# Patient Record
Sex: Female | Born: 1965 | Race: White | Hispanic: No | Marital: Married | Smoking: Never smoker
Health system: Southern US, Community
[De-identification: ages and names within clinical notes are randomized; demographics above are authoritative.]

## PROBLEM LIST (undated history)

## (undated) DIAGNOSIS — E785 Hyperlipidemia, unspecified: Secondary | ICD-10-CM

## (undated) DIAGNOSIS — K5900 Constipation, unspecified: Secondary | ICD-10-CM

## (undated) DIAGNOSIS — F329 Major depressive disorder, single episode, unspecified: Secondary | ICD-10-CM

## (undated) DIAGNOSIS — D126 Benign neoplasm of colon, unspecified: Secondary | ICD-10-CM

## (undated) DIAGNOSIS — T7840XA Allergy, unspecified, initial encounter: Secondary | ICD-10-CM

## (undated) DIAGNOSIS — D649 Anemia, unspecified: Secondary | ICD-10-CM

## (undated) DIAGNOSIS — K529 Noninfective gastroenteritis and colitis, unspecified: Secondary | ICD-10-CM

## (undated) DIAGNOSIS — F32A Depression, unspecified: Secondary | ICD-10-CM

## (undated) HISTORY — DX: Hyperlipidemia, unspecified: E78.5

## (undated) HISTORY — DX: Anemia, unspecified: D64.9

## (undated) HISTORY — DX: Noninfective gastroenteritis and colitis, unspecified: K52.9

## (undated) HISTORY — PX: PARTIAL HYSTERECTOMY: SHX80

## (undated) HISTORY — DX: Allergy, unspecified, initial encounter: T78.40XA

## (undated) HISTORY — DX: Major depressive disorder, single episode, unspecified: F32.9

## (undated) HISTORY — DX: Depression, unspecified: F32.A

## (undated) HISTORY — DX: Benign neoplasm of colon, unspecified: D12.6

## (undated) HISTORY — DX: Constipation, unspecified: K59.00

## (undated) HISTORY — PX: POLYPECTOMY: SHX149

---

## 1997-10-24 ENCOUNTER — Other Ambulatory Visit: Admission: RE | Admit: 1997-10-24 | Discharge: 1997-10-24 | Payer: Self-pay | Admitting: Obstetrics and Gynecology

## 1998-09-18 ENCOUNTER — Other Ambulatory Visit: Admission: RE | Admit: 1998-09-18 | Discharge: 1998-09-18 | Payer: Self-pay | Admitting: Obstetrics and Gynecology

## 1999-03-02 ENCOUNTER — Inpatient Hospital Stay (HOSPITAL_COMMUNITY): Admission: AD | Admit: 1999-03-02 | Discharge: 1999-03-02 | Payer: Self-pay | Admitting: Obstetrics and Gynecology

## 1999-03-02 ENCOUNTER — Encounter: Payer: Self-pay | Admitting: Obstetrics and Gynecology

## 1999-03-03 ENCOUNTER — Observation Stay (HOSPITAL_COMMUNITY): Admission: AD | Admit: 1999-03-03 | Discharge: 1999-03-04 | Payer: Self-pay | Admitting: *Deleted

## 1999-03-21 ENCOUNTER — Inpatient Hospital Stay (HOSPITAL_COMMUNITY): Admission: AD | Admit: 1999-03-21 | Discharge: 1999-03-23 | Payer: Self-pay | Admitting: Obstetrics & Gynecology

## 1999-11-04 ENCOUNTER — Other Ambulatory Visit: Admission: RE | Admit: 1999-11-04 | Discharge: 1999-11-04 | Payer: Self-pay | Admitting: Obstetrics and Gynecology

## 2000-04-06 ENCOUNTER — Observation Stay (HOSPITAL_COMMUNITY): Admission: RE | Admit: 2000-04-06 | Discharge: 2000-04-07 | Payer: Self-pay | Admitting: Obstetrics and Gynecology

## 2000-04-06 ENCOUNTER — Encounter (INDEPENDENT_AMBULATORY_CARE_PROVIDER_SITE_OTHER): Payer: Self-pay

## 2006-03-19 ENCOUNTER — Emergency Department (HOSPITAL_COMMUNITY): Admission: EM | Admit: 2006-03-19 | Discharge: 2006-03-19 | Payer: Self-pay | Admitting: Emergency Medicine

## 2006-03-22 ENCOUNTER — Encounter: Admission: RE | Admit: 2006-03-22 | Discharge: 2006-03-22 | Payer: Self-pay | Admitting: Internal Medicine

## 2006-05-31 HISTORY — PX: COLONOSCOPY: SHX174

## 2006-08-17 ENCOUNTER — Encounter: Admission: RE | Admit: 2006-08-17 | Discharge: 2006-08-17 | Payer: Self-pay | Admitting: Obstetrics and Gynecology

## 2007-04-13 ENCOUNTER — Encounter: Admission: RE | Admit: 2007-04-13 | Discharge: 2007-04-13 | Payer: Self-pay | Admitting: Obstetrics and Gynecology

## 2007-09-28 ENCOUNTER — Encounter: Admission: RE | Admit: 2007-09-28 | Discharge: 2007-09-28 | Payer: Self-pay | Admitting: Obstetrics and Gynecology

## 2008-01-25 IMAGING — CR DG ABDOMEN ACUTE W/ 1V CHEST
3 series · 3 of 3 positions shown · non-contrast
Comparison: none

CLINICAL DATA: Constipation with abdominal pain and left lower quadrant fullness.  
 ACUTE ABDOMINAL SERIES WITH CHEST ? 3 VIEW:
 No comparison.

[w chest pa]
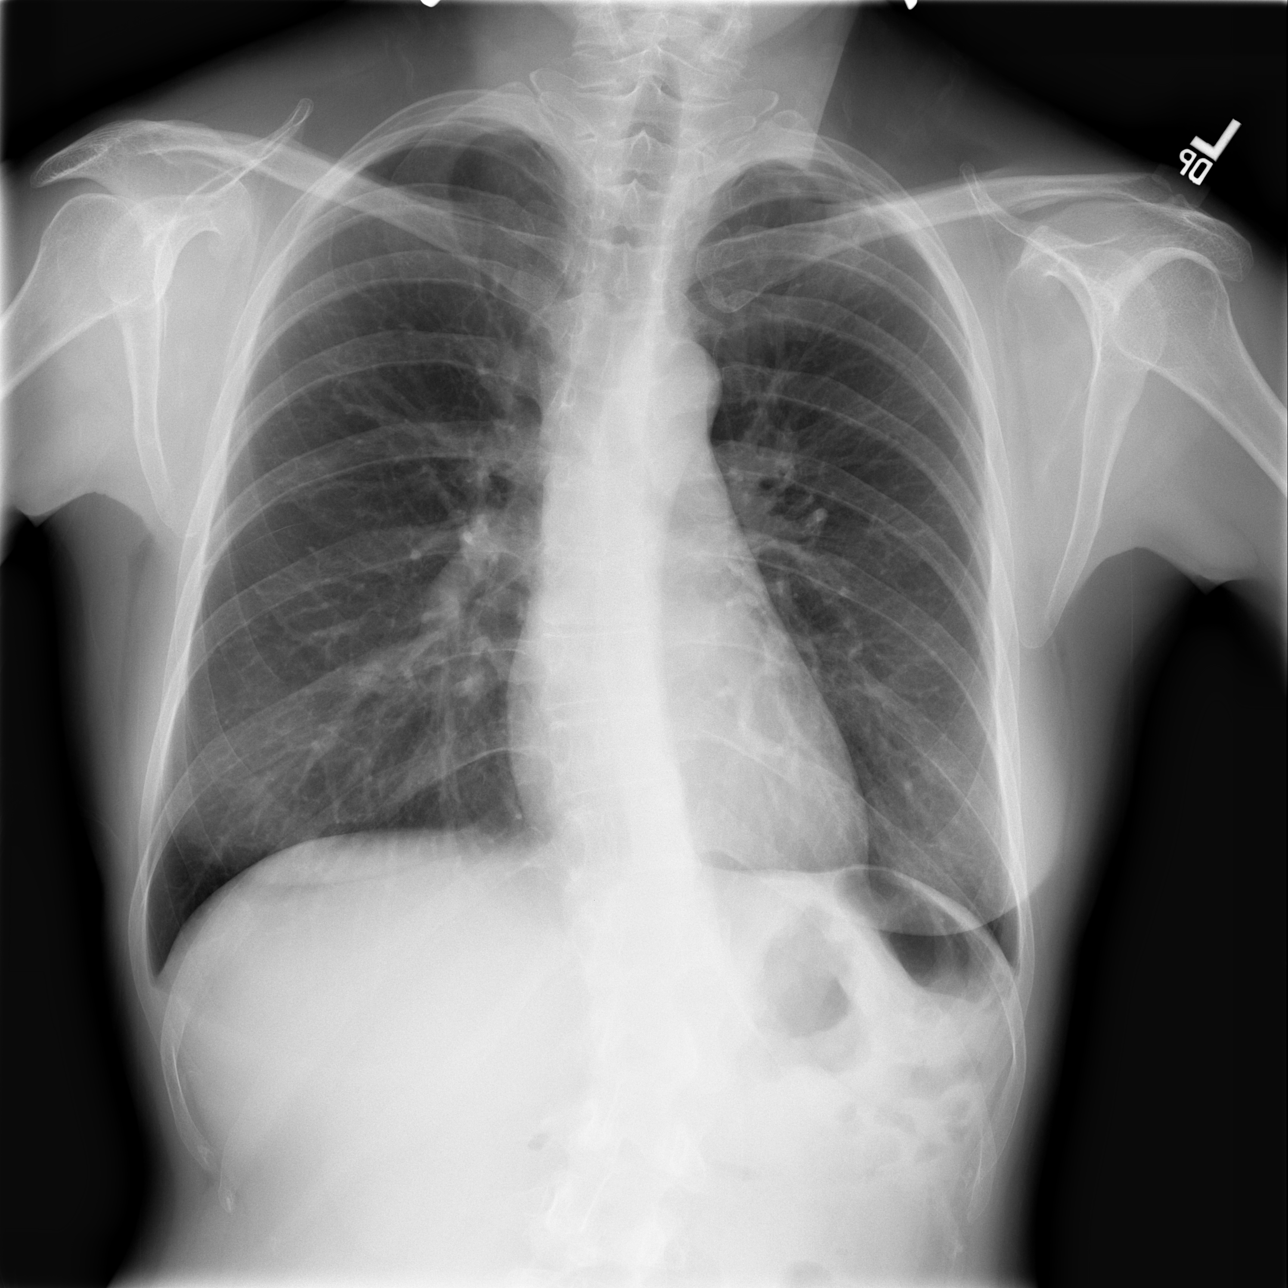

[w abdomen upright *]
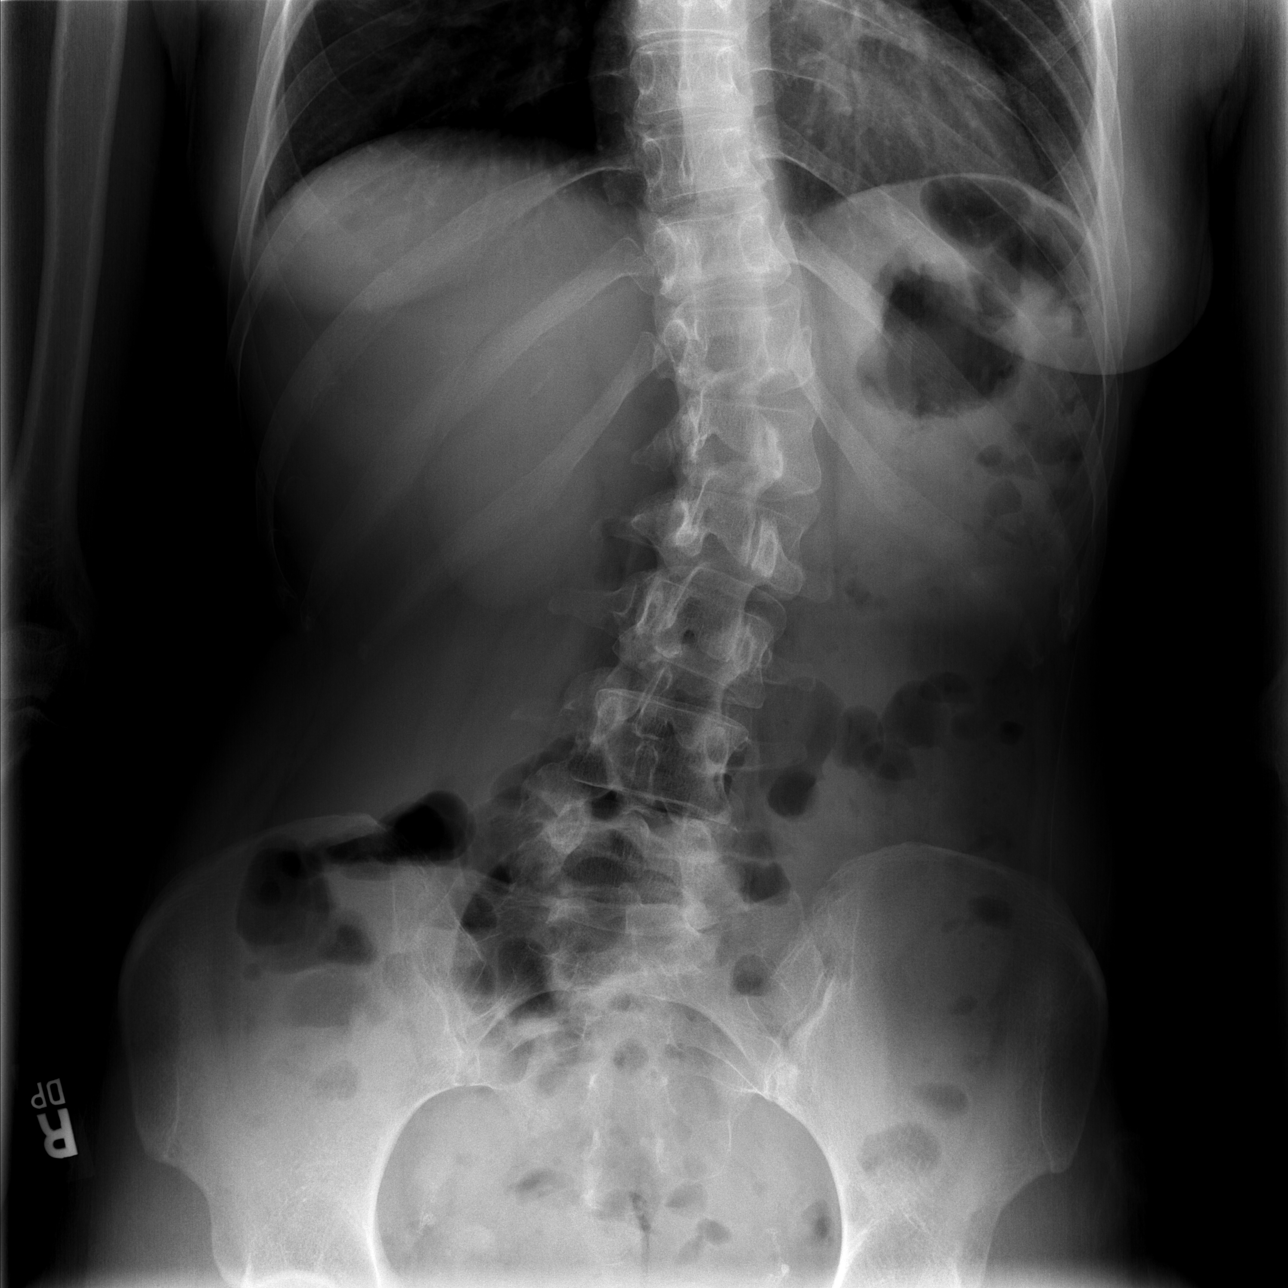

[t abdomen supine]
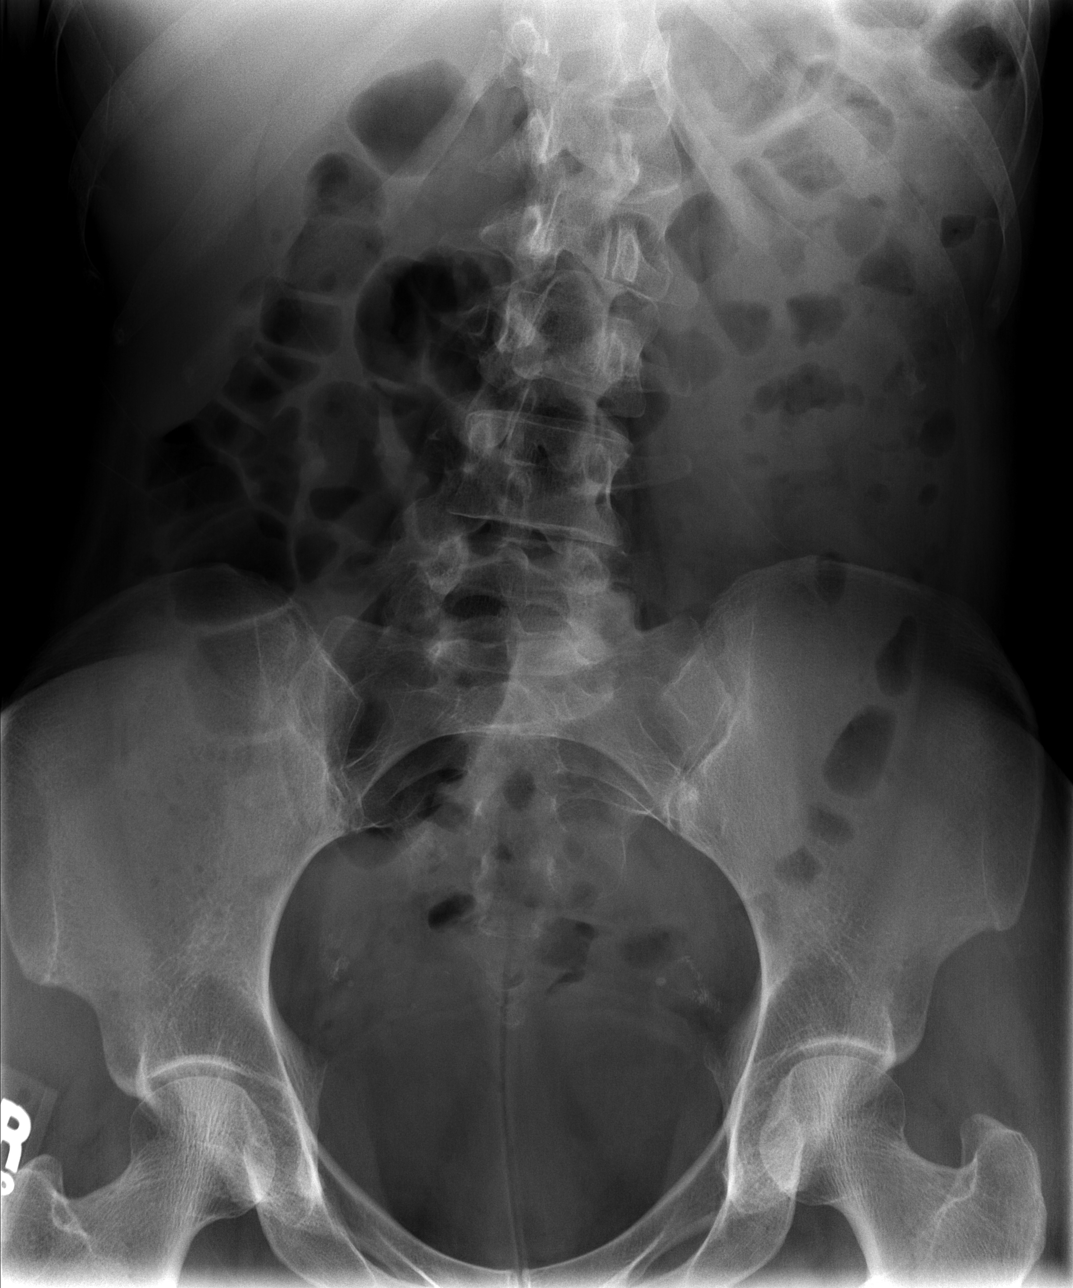

[3 of 3 positions shown; findings below may reference images not displayed]

FINDINGS: Frontal chest radiograph demonstrates a biconvex thoracolumbar scoliosis.  The cardiomediastinal contours are normal.  The lungs are clear. 
 Supine and erect views of the abdomen demonstrate a normal nonobstructive bowel gas pattern.  There does not appear to be significantly increased stool in the colon.  Pelvic surgical clips suggest previous tubal ligation.  There are no suspicious calcifications.
IMPRESSION: No active cardiopulmonary or abdominal process is demonstrated.  There is a thoracolumbar scoliosis.

## 2010-06-21 ENCOUNTER — Encounter: Payer: Self-pay | Admitting: Obstetrics and Gynecology

## 2010-10-16 NOTE — H&P (Signed)
[facility]  Patient:    Claudia Ellison                          MRN: [id number] Adm. Date:  04/06/00 Attending:  Trevor Iha, M.D.                         History and Physical  DATE OF BIRTH:                1965-07-05  HISTORY OF PRESENT ILLNESS:   Ms. Claudia Ellison is a 45 year old, G3, P3, A1, with a history of worsening menometrorrhagia since her last delivery approximately a year ago.  She continues to have bleeding every couple of weeks.  She has been tried on oral contraceptive agents, which not only did not work but also caused severe migraines and she was unable to take.  She had a sonohysterogram which showed no endometrial pathology but did show increased uterine size at 10.5 x 5.5 x 5.5 cm with a 6 mm endometrial thickness.  Because of the persistent abnormal uterine bleeding, no control with conservative medical management, the patient desires hysterectomy.  She presents today for laparoscopic-assisted vaginal hysterectomy.  PAST MEDICAL HISTORY:         Significant for mild depression and anxiety disorder, currently doing very well on Prozac.  MEDICATIONS:                  Medications at this time include Prozac 20 mg a day.  ALLERGIES:                    She has no known drug allergies.  PAST SURGICAL HISTORY:        Negative.  PAST OBSTETRICAL HISTORY:     She has had three vaginal deliveries and one miscarriage.  PHYSICAL EXAMINATION:  VITAL SIGNS:                  Blood pressure is 110/80, weight 137.  HEART:                        Regular rate and rhythm.  LUNGS:                        Clear to auscultation bilaterally.  ABDOMEN:                      Nondistended and nontender.  PELVIC:                       Examination reveals an anteverted mobile uterus, nontender, approximately 8 weeks size.  No adnexal masses are palpable.  IMPRESSION AND PLAN:          Menometrorrhagia not responsive to  conservative management.  Normal sonohysterogram.  The patient desires definitive surgical intervention and request hysterectomy.  Risks and benefits were discussed at length including but not limited to risk of infection, bleeding, damage to bowel or bladder, ureters, tubes, ovaries, need for further surgery.  Also risks associated with blood transfusion, including but not limited to risk of infection such as HIV or hepatitis or reaction to blood transfusion.  The patient does give her informed consent.  Plan, laparoscopic-assisted vaginal hysterectomy. DD:  04/06/00 TD:  04/06/00 Job: [id number]

## 2010-10-16 NOTE — Discharge Summary (Signed)
[facility] [place]  Patient:    Claudia Ellison, Claudia Ellison                   MRN: [id number] Adm. Date:  [id number] Disc. Date: [id number] Attending:  Trevor Iha                           Discharge Summary  HISTORY OF PRESENT ILLNESS:   [name] is a 45 year old G4, P3, A1 with menorrhagia and metrorrhagia for approximately the last year, grossly enlarged uterus to approximately 8-10 weeks size.  Sonohysterogram reveals no endometrial pathology.  The menomenorrhagia is not controlled with oral contraceptive agents or anti-inflammatory medications.  Patient desires definitive surgical intervention, requesting hysterectomy.  She was admitted for a laparoscopically assisted vaginal hysterectomy.  HOSPITAL COURSE:              Patient underwent laparoscopically assisted vaginal hysterectomy.  The surgery was uncomplicated.  Findings at the time of surgery were normal appendix, ovaries, tubes, liver, gallbladder, enlarged uterus approximately 8 weeks size.  Again, the surgery was uncomplicated with an estimated blood loss of 50 cc.  Her postoperative care as uncomplicated. Postoperative day #1 her hemoglobin was 11.2.  She had normal vital signs, good urine output.  By the end of postoperative day #1 she was tolerating a regular diet, ambulating without difficulty, and discharged home.  Incisions were clean, dry, and intact at time of discharge.  DISPOSITION:                  Patient to be followed up in the office in two weeks.  DISCHARGE MEDICATIONS:        1. Tylox #30.                               2. Motrin 600 mg #30.  INSTRUCTIONS:                 Instructed on light activity, return for fever, bleeding, pain, and routine instructions for postoperative hysterectomy. DD:  04/07/00 TD:  04/08/00 Job: [id number]

## 2010-10-16 NOTE — Op Note (Signed)
[facility] [place]  Patient:    Claudia Ellison, Claudia Ellison                   MRN: [id number] Adm. Date:  [id number] Attending:  Trevor Iha                           Operative Report  PREOPERATIVE DIAGNOSES:       Menomenorrhagia, enlarged uterus.  POSTOPERATIVE DIAGNOSES:      Menomenorrhagia, enlarged uterus.  PROCEDURE:                    Laparoscopically assisted vaginal hysterectomy.  SURGEON:                      Trevor Iha, M.D.  ASSISTANT:                    Duke Salvia. Marcelle Overlie, M.D.  ANESTHESIA:                   General endotracheal.  INDICATIONS:                  Ms. Claudia Ellison is a 45 year old G4, P3, A1 with menomenorrhagia for approximately the last year, grossly enlarged uterus to approximately 8-10 weeks size.  Sonohysterogram showed no endometrial pathology.  Menomenorrhagia is not controlled by oral contraceptive agents or anti-inflammatory medications.  Patients desires definitive surgical intervention, requests hysterectomy.  Risks and benefits were discussed at length.  Informed consent was obtained.  FINDINGS:                     Grossly enlarged uterus.  Normal appearing ovaries, tubes, cul-de-sac, appendix, liver, and gallbladder.  DESCRIPTION OF PROCEDURE:     After adequate general anesthesia the patient was placed in the dorsal lithotomy position.  She was sterilely prepped and draped.  Bladder was sterilely drained.  Hulka tenaculum was placed on the anterior lip of the cervix.  A 1 cm infraumbilical skin incision was made. Veress needle was inserted.  The abdomen was insufflated with dullness to percussion.  A 5 mm port was placed to the left of midline two fingerbreadths above the pubic symphysis under direct visualization.  The above findings were then noted.  The 5 mm camera was placed in the 5 mm scope and endo GI was placed through the umbilical incision and was fired twice on the left and right intra-ovarian  ligaments under direct visualization.  The broad ligaments were dissected across the round ligaments with the second endo GI firing. ______ were noted to be hemostatic and care was taken to avoid bowel and other vascular structures.  At this time the laparoscope was removed.  The abdomen was desufflated and proceeded with the vaginal portion of the case. Her legs were elevated.  Weighted speculum was placed.  Jacobs tenaculum was placed in the cervix.  Posterior colpotomy incision was made.  The cervix was circumscribed with Bovie cautery.  The Heaney clamps were clamped across the uterosacral ligaments bilaterally.  They were clamped, cut, and tied using 0 Monocryl and 0 Monocryl was used throughout the procedure unless otherwise specified.  The cardinal ligaments were then clamped, cut, and tied.  Bladder pillars clamped, cut, and tied.  At this time the bladder was dissected off the anterior surface of the cervix.  Peritoneum was entered sharply and bladder blade was placed below the  bladder.  Successive clamp, cut, and tie with 0 Monocryl were taken up to the cardinal ligaments up to the inferior portion of the round ligament.  At this time the fundus of the uterus was grasped with the ______ tenaculum and delivered into the introitus.  The remaining pedicle was clamped, cut, and tied and the uterus was removed intact.  Examination of the pedicles and the ovaries at the time revealed good hemostasis.  At this time small packing was placed.  The uterosacral ligaments were identified bilaterally, ligated again with 0 Monocryl.  The posterior peritoneum was then closed in a purse string fashion with 0 Monocryl. Uterosacral ligaments were then plicated in the midline.  The vaginal mucosa was closed in a vertical fashion with figure-of-eight and 0 Monocryl. Approximation of the uterosacral ligaments after the posterior vaginal mucosa was approximated.  The packing was removed and the anterior  vaginal mucosa was closed in a similar fashion with figure-of-eight of 0 Monocryl in a vertical fashion with good approximation and good hemostasis.  At this time the speculum was removed.  Foley catheter was replaced.  Return of clear yellow urine was found.  At this time reinsufflation of the abdomen revealed good hemostasis.  Irrigation was applied.  After adequate hemostasis was assured small areas of peritoneal edge were cauterized with bipolar cautery.  At this time the Trocars were removed, noted to be hemostatic.  The fascia and the umbilical incision was closed with 0 Vicryl.  The skin was closed with 3-0 Vicryl rapide subcuticular stitch.  The 5 mm port was closed with 3-0 Vicryl rapide subcuticular stitch.  The incisions were injected with 1/4% Marcaine. The patient was stable on transfer to the recovery room.  Sponge, needle, and instrument count was normal x 3.  Estimated blood loss of the procedure was 40 cc. DD:  04/06/00 TD:  04/06/00 Job: [id number]

## 2013-02-26 DIAGNOSIS — Z7189 Other specified counseling: Secondary | ICD-10-CM | POA: Insufficient documentation

## 2013-06-26 DIAGNOSIS — L908 Other atrophic disorders of skin: Secondary | ICD-10-CM

## 2013-06-26 DIAGNOSIS — L988 Other specified disorders of the skin and subcutaneous tissue: Secondary | ICD-10-CM | POA: Insufficient documentation

## 2017-08-29 ENCOUNTER — Encounter: Payer: Self-pay | Admitting: Gastroenterology

## 2017-10-04 ENCOUNTER — Ambulatory Visit (AMBULATORY_SURGERY_CENTER): Payer: Self-pay

## 2017-10-04 VITALS — Ht 67.0 in | Wt 151.8 lb

## 2017-10-04 DIAGNOSIS — Z1211 Encounter for screening for malignant neoplasm of colon: Secondary | ICD-10-CM

## 2017-10-04 MED ORDER — NA SULFATE-K SULFATE-MG SULF 17.5-3.13-1.6 GM/177ML PO SOLN: 1.0000 | Freq: Once | ORAL | 0 refills | Status: AC

## 2017-10-04 NOTE — Progress Notes (Signed)
Per pt, no allergies to soy or egg products.Pt not taking any weight loss meds or using  O2 at home.  Pt refused emmi video. 

## 2017-10-14 ENCOUNTER — Telehealth: Payer: Self-pay | Admitting: Gastroenterology

## 2017-10-14 ENCOUNTER — Encounter: Payer: Self-pay | Admitting: Gastroenterology

## 2017-10-14 NOTE — Telephone Encounter (Signed)
Ok to charge

## 2017-10-18 ENCOUNTER — Encounter: Payer: Self-pay | Admitting: Gastroenterology

## 2017-12-19 ENCOUNTER — Ambulatory Visit (AMBULATORY_SURGERY_CENTER): Payer: Self-pay | Admitting: *Deleted

## 2017-12-19 VITALS — Ht 67.0 in | Wt 144.0 lb

## 2017-12-19 DIAGNOSIS — Z1211 Encounter for screening for malignant neoplasm of colon: Secondary | ICD-10-CM

## 2017-12-19 NOTE — Progress Notes (Signed)
Patient denies any allergies to eggs or soy. Patient denies any problems with anesthesia/sedation. Patient denies any oxygen use at home. Patient denies taking any diet/weight loss medications or blood thinners. EMMI education offered pt declined. Patient states she has liquid stool and constipation, she takes magnesium and this helps. Explained to patient she may need to take laxative/Miralax if she has constipation before exam. Per pt no changes in history since last pv. Pt has sample suprep at home.

## 2017-12-30 ENCOUNTER — Encounter: Payer: Self-pay | Admitting: Gastroenterology

## 2017-12-30 ENCOUNTER — Ambulatory Visit (AMBULATORY_SURGERY_CENTER): Payer: 59 | Admitting: Gastroenterology

## 2017-12-30 VITALS — BP 125/89 | HR 72 | Temp 98.2°F | Resp 18 | Ht 67.0 in | Wt 144.0 lb

## 2017-12-30 DIAGNOSIS — Z1211 Encounter for screening for malignant neoplasm of colon: Secondary | ICD-10-CM

## 2017-12-30 DIAGNOSIS — K529 Noninfective gastroenteritis and colitis, unspecified: Secondary | ICD-10-CM

## 2017-12-30 DIAGNOSIS — K633 Ulcer of intestine: Secondary | ICD-10-CM

## 2017-12-30 DIAGNOSIS — D122 Benign neoplasm of ascending colon: Secondary | ICD-10-CM

## 2017-12-30 MED ORDER — SODIUM CHLORIDE 0.9 % IV SOLN: 500.0000 mL | Freq: Once | INTRAVENOUS | Status: DC

## 2017-12-30 NOTE — Progress Notes (Signed)
Called to room to assist during endoscopic procedure.  Patient ID and intended procedure confirmed with present staff. Received instructions for my participation in the procedure from the performing physician.  

## 2017-12-30 NOTE — Progress Notes (Signed)
Pt's states no medical or surgical changes since previsit or office visit. 

## 2017-12-30 NOTE — Patient Instructions (Signed)
*   Handout given on polyps, hemorrhoids, no aspirin, ibuprofen, naproxen, or other non-steroidal anti inflammatory drugs*  YOU HAD AN ENDOSCOPIC PROCEDURE TODAY AT [facility]:   Refer to the procedure report that was given to you for any specific questions about what was found during the examination.  If the procedure report does not answer your questions, please call your gastroenterologist to clarify.  If you requested that your care partner not be given the details of your procedure findings, then the procedure report has been included in a sealed envelope for you to review at your convenience later.  YOU SHOULD EXPECT: Some feelings of bloating in the abdomen. Passage of more gas than usual.  Walking can help get rid of the air that was put into your GI tract during the procedure and reduce the bloating. If you had a lower endoscopy (such as a colonoscopy or flexible sigmoidoscopy) you may notice spotting of blood in your stool or on the toilet paper. If you underwent a bowel prep for your procedure, you may not have a normal bowel movement for a few days.  Please Note:  You might notice some irritation and congestion in your nose or some drainage.  This is from the oxygen used during your procedure.  There is no need for concern and it should clear up in a day or so.  SYMPTOMS TO REPORT IMMEDIATELY:   Following lower endoscopy (colonoscopy or flexible sigmoidoscopy):  Excessive amounts of blood in the stool  Significant tenderness or worsening of abdominal pains  Swelling of the abdomen that is new, acute  Fever of 100F or higher   For urgent or emergent issues, a gastroenterologist can be reached at any hour by calling [phone].   DIET:  We do recommend a small meal at first, but then you may proceed to your regular diet.  Drink plenty of fluids but you should avoid alcoholic beverages for 24 hours.  ACTIVITY:  You should plan to take it easy for the rest of today  and you should NOT DRIVE or use heavy machinery until tomorrow (because of the sedation medicines used during the test).    FOLLOW UP: Our staff will call the number listed on your records the next business day following your procedure to check on you and address any questions or concerns that you may have regarding the information given to you following your procedure. If we do not reach you, we will leave a message.  However, if you are feeling well and you are not experiencing any problems, there is no need to return our call.  We will assume that you have returned to your regular daily activities without incident.  If any biopsies were taken you will be contacted by phone or by letter within the next 1-3 weeks.  Please call us at [phone] if you have not heard about the biopsies in 3 weeks.    SIGNATURES/CONFIDENTIALITY: You and/or your care partner have signed paperwork which will be entered into your electronic medical record.  These signatures attest to the fact that that the information above on your After Visit Summary has been reviewed and is understood.  Full responsibility of the confidentiality of this discharge information lies with you and/or your care-partner.

## 2017-12-30 NOTE — Op Note (Signed)
[facility] Patient Name: Claudia Ellison Procedure Date: 12/30/2017 9:52 AM MRN: [id number] Endoscopist: Mauri Pole , MD Age: 52 Referring MD:  Date of Birth: 1965/10/17 Gender: Female Account #: [id number] Procedure:                Colonoscopy Indications:              Screening for colorectal malignant neoplasm Medicines:                Monitored Anesthesia Care Procedure:                Pre-Anesthesia Assessment:                           - Prior to the procedure, a History and Physical                            was performed, and patient medications and                            allergies were reviewed. The patient's tolerance of                            previous anesthesia was also reviewed. The risks                            and benefits of the procedure and the sedation                            options and risks were discussed with the patient.                            All questions were answered, and informed consent                            was obtained. Prior Anticoagulants: The patient has                            taken no previous anticoagulant or antiplatelet                            agents. ASA Grade Assessment: II - A patient with                            mild systemic disease. After reviewing the risks                            and benefits, the patient was deemed in                            satisfactory condition to undergo the procedure.                           After obtaining informed consent, the colonoscope  was passed under direct vision. Throughout the                            procedure, the patient's blood pressure, pulse, and                            oxygen saturations were monitored continuously. The                            Colonoscope was introduced through the anus and                            advanced to the the cecum, identified by                            appendiceal orifice and  ileocecal valve. The                            colonoscopy was somewhat difficult due to                            restricted mobility of the colon and significant                            looping. Successful completion of the procedure was                            aided by applying abdominal pressure. The patient                            tolerated the procedure well. The quality of the                            bowel preparation was adequate. The ileocecal                            valve, appendiceal orifice, and rectum were                            photographed. Scope In: 9:53:43 AM Scope Out: 10:27:00 AM Scope Withdrawal Time: 0 hours 16 minutes 33 seconds  Total Procedure Duration: 0 hours 33 minutes 17 seconds  Findings:                 The perianal and digital rectal examinations were                            normal.                           A 12 mm polyp was found in the ascending colon. The                            polyp was sessile. The polyp was removed with a  cold snare. Resection and retrieval were complete.                           A 14 mm polyp was found in the ascending colon. The                            polyp was mixed lateral spreading. The polyp was                            removed with a piecemeal technique using a cold                            snare. Resection and retrieval were complete.                           A few scattered non-bleeding erosions were found in                            the recto-sigmoid colon, in the sigmoid colon and                            in the descending colon. Biopsies were taken with a                            cold forceps for histology.                           Non-bleeding internal hemorrhoids were found during                            retroflexion. The hemorrhoids were small. Complications:            No immediate complications. Estimated Blood Loss:     Estimated blood loss  was minimal. Impression:               - One 12 mm polyp in the ascending colon, removed                            with a cold snare. Resected and retrieved.                           - One 14 mm polyp in the ascending colon, removed                            piecemeal using a cold snare. Resected and                            retrieved.                           - A few erosions in the recto-sigmoid colon, in the                            sigmoid colon and in the  descending colon. Biopsied.                           - Non-bleeding internal hemorrhoids. Recommendation:           - Patient has a contact number available for                            emergencies. The signs and symptoms of potential                            delayed complications were discussed with the                            patient. Return to normal activities tomorrow.                            Written discharge instructions were provided to the                            patient.                           - Resume previous diet.                           - Continue present medications.                           - Await pathology results.                           - Repeat colonoscopy in 1 year for surveillance                            after piecemeal polypectomy and for surveillance                            based on pathology results.                           - No aspirin, ibuprofen, naproxen, or other                            non-steroidal anti-inflammatory drugs. Mauri Pole, MD 12/30/2017 10:32:11 AM This report has been signed electronically.

## 2017-12-30 NOTE — Progress Notes (Signed)
A/ox3, pleased with MAC, report to RN 

## 2018-01-02 ENCOUNTER — Telehealth: Payer: Self-pay | Admitting: *Deleted

## 2018-01-02 NOTE — Telephone Encounter (Signed)
  Follow up Call-  Call back number 12/30/2017  Post procedure Call Back phone  # [phone]  Permission to leave phone message Yes  Some recent data might be hidden    [facility]

## 2018-01-02 NOTE — Telephone Encounter (Signed)
  Follow up Call-  Call back number 12/30/2017  Post procedure Call Back phone  # [phone]  Permission to leave phone message Yes  Some recent data might be hidden    [facility]

## 2018-01-04 ENCOUNTER — Other Ambulatory Visit: Payer: Self-pay

## 2018-01-04 MED ORDER — MESALAMINE 1.2 G PO TBEC: 1.2000 g | DELAYED_RELEASE_TABLET | Freq: Every day | ORAL | 3 refills | Status: DC

## 2018-03-15 ENCOUNTER — Ambulatory Visit: Payer: 59 | Admitting: Gastroenterology

## 2018-04-13 ENCOUNTER — Ambulatory Visit (INDEPENDENT_AMBULATORY_CARE_PROVIDER_SITE_OTHER): Payer: 59 | Admitting: Gastroenterology

## 2018-04-13 ENCOUNTER — Encounter: Payer: Self-pay | Admitting: Gastroenterology

## 2018-04-13 VITALS — BP 126/80 | HR 128 | Ht 67.0 in | Wt 141.4 lb

## 2018-04-13 DIAGNOSIS — K51818 Other ulcerative colitis with other complication: Secondary | ICD-10-CM

## 2018-04-13 DIAGNOSIS — R197 Diarrhea, unspecified: Secondary | ICD-10-CM

## 2018-04-13 MED ORDER — MESALAMINE 1.2 G PO TBEC: 2.4000 g | DELAYED_RELEASE_TABLET | Freq: Every day | ORAL | 3 refills | Status: DC

## 2018-04-13 NOTE — Progress Notes (Signed)
Claudia Ellison    [id number]    05-22-1966  Primary Care Physician:Shaw, Gwyndolyn Saxon, MD  Referring Physician: Marton Redwood, MD [address] [place], [place] [address]  Chief complaint: Diarrhea  HPI: 52 year old female here for follow-up visit after colonoscopy. She continues to have intermittent episodes of diarrhea.  Did not notice any significant improvement with Lialda.  Denies any blood in stool or blood per rectum  Colonoscopy December 31, 2007: Sessile polyps (sessile serrated adenoma) greater than 1 cm in the right colon removed piecemeal, mild colitis in the rectosigmoid area scattered erosions and erythema.  Biopsies suggestive of acute colitis.  Differential includes acute ischemia versus NSAID's versus infectious versus early IBD  No abdominal pain, nausea, vomiting, loss of appetite or weight loss.  No recent antibiotics or travel.  She takes magnesium oxide on and off.   Outpatient Encounter Medications as of 04/13/2018  Medication Sig  . buPROPion (WELLBUTRIN XL) 300 MG 24 hr tablet Take 300 mg by mouth daily.  Marland Kitchen estradiol (ESTRACE) 2 MG tablet Take 2 mg by mouth daily.  . Magnesium Oxide (MAG-200 PO) Take 400 mg by mouth daily.  . mesalamine (LIALDA) 1.2 g EC tablet Take 1 tablet (1.2 g total) by mouth daily with breakfast.  . Multiple Vitamin (MULTIVITAMIN) tablet Take 1 tablet by mouth daily.   Facility-Administered Encounter Medications as of 04/13/2018  Medication  . 0.9 %  sodium chloride infusion    Allergies as of 04/13/2018  . (No Known Allergies)    Past Medical History:  Diagnosis Date  . Depression     Past Surgical History:  Procedure Laterality Date  . COLONOSCOPY  2008  . PARTIAL HYSTERECTOMY  at age 52    Family History  Problem Relation Age of Onset  . Heart disease Mother   . Colon cancer Neg Hx   . Esophageal cancer Neg Hx   . Stomach cancer Neg Hx   . Rectal cancer Neg Hx     Social History   Socioeconomic  History  . Marital status: Married    Spouse name: Not on file  . Number of children: Not on file  . Years of education: Not on file  . Highest education level: Not on file  Occupational History  . Not on file  Social Needs  . Financial resource strain: Not on file  . Food insecurity:    Worry: Not on file    Inability: Not on file  . Transportation needs:    Medical: Not on file    Non-medical: Not on file  Tobacco Use  . Smoking status: Never Smoker  . Smokeless tobacco: Never Used  Substance and Sexual Activity  . Alcohol use: Yes    Alcohol/week: 6.0 - 7.0 standard drinks    Types: 6 - 7 Standard drinks or equivalent per week    Comment: 6-7  . Drug use: Not Currently  . Sexual activity: Not on file  Lifestyle  . Physical activity:    Days per week: Not on file    Minutes per session: Not on file  . Stress: Not on file  Relationships  . Social connections:    Talks on phone: Not on file    Gets together: Not on file    Attends religious service: Not on file    Active member of club or organization: Not on file    Attends meetings of clubs or organizations: Not on file  Relationship status: Not on file  . Intimate partner violence:    Fear of current or ex partner: Not on file    Emotionally abused: Not on file    Physically abused: Not on file    Forced sexual activity: Not on file  Other Topics Concern  . Not on file  Social History Narrative  . Not on file      Review of systems: Review of Systems  Constitutional: Negative for fever and chills.  HENT: Negative.   Eyes: Negative for blurred vision.  Respiratory: Negative for cough, shortness of breath and wheezing.   Cardiovascular: Negative for chest pain and palpitations.  Gastrointestinal: as per HPI Genitourinary: Negative for dysuria, urgency, frequency and hematuria.  Musculoskeletal: Negative for myalgias, back pain and joint pain.  Skin: Negative for itching and rash.  Neurological:  Negative for dizziness, tremors, focal weakness, seizures and loss of consciousness.  Endo/Heme/Allergies: Negative for seasonal allergies.  Psychiatric/Behavioral: Negative for depression, suicidal ideas and hallucinations.  All other systems reviewed and are negative.   Physical Exam: Vitals:   04/13/18 0852  BP: 126/80  Pulse: (!) 128   Body mass index is 22.14 kg/m. Gen:      No acute distress HEENT:  EOMI, sclera anicteric Neck:     No masses; no thyromegaly Lungs:    Clear to auscultation bilaterally; normal respiratory effort CV:         Regular rate and rhythm; no murmurs Abd:      + bowel sounds; soft, non-tender; no palpable masses, no distension Ext:    No edema; adequate peripheral perfusion Skin:      Warm and dry; no rash Neuro: alert and oriented x 3 Psych: normal mood and affect  Data Reviewed:  Reviewed labs, radiology imaging, old records and pertinent past GI work up   Assessment and Plan/Recommendations:  52 year old female with chronic intermittent diarrhea. Noted findings suggestive of active colitis in rectosigmoid area during recent colonoscopy Advised patient to avoid NSAIDs Will increase Lialda to 2.4 g daily.  If no significant improvement will consider starting Uceris 9 mg daily Start Benefiber 1 teaspoon 3 times daily with meals Stop magnesium oxide Return in 3 months or sooner if needed  25 minutes was spent face-to-face with the patient. Greater than 50% of the time used for counseling as well as treatment plan and follow-up. She had multiple questions which were answered to her satisfaction  K. Denzil Magnuson , MD [phone]    CC: Marton Redwood, MD

## 2018-04-13 NOTE — Patient Instructions (Addendum)
We have sent a new prescription of Lialda to your pharmacy, Increase to two tablets once daily  Take Benefiber 1 teaspoon three times a day with meals  STOP magnesium oxide    If you are age 53 or older, your body mass index should be between 23-30. Your Body mass index is 22.14 kg/m. If this is out of the aforementioned range listed, please consider follow up with your Primary Care Provider.  If you are age 24 or younger, your body mass index should be between 19-25. Your Body mass index is 22.14 kg/m. If this is out of the aformentioned range listed, please consider follow up with your Primary Care Provider.    Thank you for choosing [facility]  Karleen Hampshire Nandigam,MD

## 2018-05-17 ENCOUNTER — Ambulatory Visit (INDEPENDENT_AMBULATORY_CARE_PROVIDER_SITE_OTHER): Payer: 59 | Admitting: Gastroenterology

## 2018-05-17 ENCOUNTER — Encounter: Payer: Self-pay | Admitting: Gastroenterology

## 2018-05-17 VITALS — BP 137/80 | HR 100 | Ht 67.0 in | Wt 144.0 lb

## 2018-05-17 DIAGNOSIS — K5909 Other constipation: Secondary | ICD-10-CM | POA: Diagnosis not present

## 2018-05-17 DIAGNOSIS — K5180 Other ulcerative colitis without complications: Secondary | ICD-10-CM | POA: Diagnosis not present

## 2018-05-17 MED ORDER — MESALAMINE ER 0.375 G PO CP24: 750.0000 mg | ORAL_CAPSULE | Freq: Every day | ORAL | 6 refills | Status: DC

## 2018-05-17 NOTE — Patient Instructions (Signed)
We have sent in [place] to your pharmacy to see if its covered better by your insurance   Take Miralax 1 capful every other day  Take Benefiber 1 teaspoon three times a day  Follow up in 6 months  If you are age 52 or older, your body mass index should be between 23-30. Your Body mass index is 36.25 kg/m. If this is out of the aforementioned range listed, please consider follow up with your Primary Care Provider.  If you are age 32 or younger, your body mass index should be between 19-25. Your Body mass index is 36.25 kg/m. If this is out of the aformentioned range listed, please consider follow up with your Primary Care Provider.    Thank you for choosing [facility]  Karleen Hampshire Nandigam,MD

## 2018-05-17 NOTE — Progress Notes (Signed)
Claudia Ellison    [id number]    1965-08-20  Primary Care Physician:Shaw, Gwyndolyn Saxon, MD  Referring Physician: Marton Redwood, MD [address] [place],  [address]  Chief complaint: Colitis  HPI:  52 year old female here for follow-up visit.  Last seen in office April 13, 2018  Colonoscopy December 31, 2007: Sessile polyps (sessile serrated adenoma) greater than 1 cm in the right colon removed piecemeal, mild colitis in the rectosigmoid area scattered erosions and erythema.  Biopsies suggestive of acute colitis.  Differential includes acute ischemia versus NSAID's versus infectious versus early IBD  Diarrhea has resolved with Lialda 2 tablets daily and she also stopped taking magnesium oxide.  She no longer has fecal urgency.  Currently having bowel movement 2-3 times a week.  She has abdominal discomfort if does not have a bowel movement for more than 3 to 4 days.  She is taking Benefiber daily.  Has intermittent abdominal bloating especially if does not have a bowel movement for more than 3 or 4 days.  Denies any rectal bleeding, nausea, vomiting, loss of appetite or weight loss.    Outpatient Encounter Medications as of 05/17/2018  Medication Sig  . buPROPion (WELLBUTRIN XL) 300 MG 24 hr tablet Take 300 mg by mouth daily.  Marland Kitchen estradiol (ESTRACE) 2 MG tablet Take 2 mg by mouth daily.  . mesalamine (LIALDA) 1.2 g EC tablet Take 2 tablets (2.4 g total) by mouth daily with breakfast.  . Multiple Vitamin (MULTIVITAMIN) tablet Take 1 tablet by mouth daily.  . [DISCONTINUED] Magnesium Oxide (MAG-200 PO) Take 400 mg by mouth daily.   Facility-Administered Encounter Medications as of 05/17/2018  Medication  . 0.9 %  sodium chloride infusion    Allergies as of 05/17/2018  . (No Known Allergies)    Past Medical History:  Diagnosis Date  . Adenomatous colon polyp   . Colitis   . Depression     Past Surgical History:  Procedure Laterality Date  . COLONOSCOPY   2008  . PARTIAL HYSTERECTOMY  at age 50    Family History  Problem Relation Age of Onset  . Heart disease Mother   . Colon cancer Neg Hx   . Esophageal cancer Neg Hx   . Stomach cancer Neg Hx   . Rectal cancer Neg Hx     Social History   Socioeconomic History  . Marital status: Married    Spouse name: Not on file  . Number of children: Not on file  . Years of education: Not on file  . Highest education level: Not on file  Occupational History  . Not on file  Social Needs  . Financial resource strain: Not on file  . Food insecurity:    Worry: Not on file    Inability: Not on file  . Transportation needs:    Medical: Not on file    Non-medical: Not on file  Tobacco Use  . Smoking status: Never Smoker  . Smokeless tobacco: Never Used  Substance and Sexual Activity  . Alcohol use: Yes    Alcohol/week: 6.0 - 7.0 standard drinks    Types: 6 - 7 Standard drinks or equivalent per week    Comment: 6-7  . Drug use: Not Currently  . Sexual activity: Not on file  Lifestyle  . Physical activity:    Days per week: Not on file    Minutes per session: Not on file  . Stress: Not on file  Relationships  . Social connections:    Talks on phone: Not on file    Gets together: Not on file    Attends religious service: Not on file    Active member of club or organization: Not on file    Attends meetings of clubs or organizations: Not on file    Relationship status: Not on file  . Intimate partner violence:    Fear of current or ex partner: Not on file    Emotionally abused: Not on file    Physically abused: Not on file    Forced sexual activity: Not on file  Other Topics Concern  . Not on file  Social History Narrative  . Not on file      Review of systems: Review of Systems  Constitutional: Negative for fever and chills.  HENT: Negative.   Eyes: Negative for blurred vision.  Respiratory: Negative for cough, shortness of breath and wheezing.   Cardiovascular:  Negative for chest pain and palpitations.  Gastrointestinal: as per HPI Genitourinary: Negative for dysuria, urgency, frequency and hematuria.  Musculoskeletal: Negative for myalgias, back pain and joint pain.  Skin: Negative for itching and rash.  Neurological: Negative for dizziness, tremors, focal weakness, seizures and loss of consciousness.  Endo/Heme/Allergies: Positive for seasonal allergies.  Psychiatric/Behavioral: Negative for depression, suicidal ideas and hallucinations.  All other systems reviewed and are negative.   Physical Exam: Vitals:   05/17/18 1329  BP: 137/80  Pulse: 100  SpO2: 94%   Body mass index is 22.55 kg/m. Gen:      No acute distress HEENT:  EOMI, sclera anicteric Neck:     No masses; no thyromegaly Lungs:    Clear to auscultation bilaterally; normal respiratory effort CV:         Regular rate and rhythm; no murmurs Abd:      + bowel sounds; soft, non-tender; no palpable masses, no distension Ext:    No edema; adequate peripheral perfusion Skin:      Warm and dry; no rash Neuro: alert and oriented x 3 Psych: normal mood and affect  Data Reviewed:  Reviewed labs, radiology imaging, old records and pertinent past GI work up   Assessment and Plan/Recommendations: 52 year old female with intermittent diarrhea colonoscopy with biopsies suggestive of active colitis in the rectosigmoid colon Diarrhea has improved with Lialda 2.4 g daily Avoid NSAIDs  Currently having worsening constipation Advised patient to continue Benefiber 1 teaspoon 3 times daily Increase water intake to 8 to 10 cups daily Start MiraLAX 1 capful daily as needed Return in 6 months   K. Denzil Magnuson , MD [phone]    CC: Marton Redwood, MD

## 2018-05-25 ENCOUNTER — Telehealth: Payer: Self-pay | Admitting: Gastroenterology

## 2018-05-25 NOTE — Telephone Encounter (Signed)
Pt's husband asks for an alt pscrp for Apriso sent to [facility].  He states that med was too expensive - $194.  Pt would like a call back.

## 2018-05-25 NOTE — Telephone Encounter (Signed)
Pt husband states that he needs a call back today the medication at the pharm was too expensive. And they are needing something else called in .

## 2018-05-26 ENCOUNTER — Telehealth: Payer: Self-pay | Admitting: Gastroenterology

## 2018-05-26 NOTE — Telephone Encounter (Signed)
Pt states other medication isnt covered either and she tried good rx and the price is all the same or more

## 2018-05-26 NOTE — Telephone Encounter (Signed)
I spoke with Will who is Claudia Ellison's husband and he said Claudia Ellison was at the [facility]. He is going to see if they can find out what the insurance covers and investigate other options such as [facility] and call us back.

## 2018-05-26 NOTE — Telephone Encounter (Signed)
Spoke to [place] and she is going to call her insurance company and call us back with what they cover at a better cost.

## 2018-05-26 NOTE — Telephone Encounter (Signed)
Any suggestions Dr Silverio Decamp? Thank you.

## 2018-05-26 NOTE — Telephone Encounter (Signed)
Please check if Lialda or Delzicol is covered better? Thanks

## 2018-05-29 NOTE — Telephone Encounter (Signed)
ok 

## 2018-06-01 NOTE — Telephone Encounter (Signed)
I spoke with Claudia Ellison and she said her insurance charges [phone] for those medicines as well per month. She said she cannot do that. She checked on [facility] and it was over $200.00. Any other suggestions for her , thank you.

## 2018-06-05 NOTE — Telephone Encounter (Signed)
Please check if Sulfasalazine is covered ? Sulfasalazine 1gm BID and also advise patient to take Folic acid 1mg  daily along with sulfasalazine. Thanks

## 2018-06-05 NOTE — Telephone Encounter (Signed)
See the other phone notes under 05/25/2018 date.

## 2018-06-05 NOTE — Telephone Encounter (Signed)
I have called and spoken with husband Will. He requested I call back and leave a detailed message as he was driving to pick up Jana Half. I have done this and will await their call back.

## 2018-06-12 NOTE — Telephone Encounter (Signed)
Spoke with Denya and she said she misunderstood the message and that she will call the insurance company and call me back.

## 2018-06-14 ENCOUNTER — Telehealth: Payer: Self-pay | Admitting: Gastroenterology

## 2018-06-14 MED ORDER — SULFASALAZINE 500 MG PO TABS: 1000.0000 mg | ORAL_TABLET | Freq: Two times a day (BID) | ORAL | 11 refills | Status: DC

## 2018-06-14 NOTE — Telephone Encounter (Signed)
Please call the patient at [phone] She did not have any luck with talking to her insurance. They were unhelpful. Very frustrated. She looked at [facility] and wants to talk with you.  Thanks

## 2018-06-14 NOTE — Addendum Note (Signed)
Addended by: [place], Mir Fullilove E on: 06/14/2018 02:38 PM   Modules accepted: Orders

## 2018-06-14 NOTE — Telephone Encounter (Signed)
I spoke with Claudia Ellison and she has the goodrx app on her phone. She can get the sulfasalazine for $15.00 per month at the [facility]. I confirmed the dosage in person with Dr Silverio Decamp and sent it in. She will pick up the folic acid when she goes to the pharmacy.

## 2018-06-14 NOTE — Telephone Encounter (Signed)
I spoke with Jana Half , see phone note from 05/25/2018.

## 2019-01-01 ENCOUNTER — Encounter: Payer: Self-pay | Admitting: Gastroenterology

## 2019-01-04 ENCOUNTER — Encounter: Payer: Self-pay | Admitting: Gastroenterology

## 2019-01-08 DIAGNOSIS — K529 Noninfective gastroenteritis and colitis, unspecified: Secondary | ICD-10-CM | POA: Insufficient documentation

## 2019-01-08 DIAGNOSIS — D126 Benign neoplasm of colon, unspecified: Secondary | ICD-10-CM | POA: Insufficient documentation

## 2019-01-16 ENCOUNTER — Other Ambulatory Visit: Payer: Self-pay

## 2019-01-16 ENCOUNTER — Ambulatory Visit (AMBULATORY_SURGERY_CENTER): Payer: Self-pay | Admitting: *Deleted

## 2019-01-16 VITALS — Temp 97.3°F | Ht 67.0 in | Wt 134.0 lb

## 2019-01-16 DIAGNOSIS — K529 Noninfective gastroenteritis and colitis, unspecified: Secondary | ICD-10-CM

## 2019-01-16 DIAGNOSIS — Z86010 Personal history of colonic polyps: Secondary | ICD-10-CM

## 2019-01-16 MED ORDER — SUPREP BOWEL PREP KIT 17.5-3.13-1.6 GM/177ML PO SOLN: 1.0000 | Freq: Once | ORAL | 0 refills | Status: AC

## 2019-01-16 NOTE — Progress Notes (Signed)
No egg or soy allergy known to patient  No issues with past sedation with any surgeries  or procedures, no intubation problems  No diet pills per patient No home 02 use per patient  No blood thinners per patient  Pt states  issues with constipation - uses Benefiber daily and uses Miralax prn only  -will do a 2 day Suprep due to her constipation issues  No A fib or A flutter  EMMI video declined

## 2019-01-26 ENCOUNTER — Encounter: Payer: Self-pay | Admitting: Gastroenterology

## 2019-02-06 ENCOUNTER — Encounter: Payer: 59 | Admitting: Gastroenterology

## 2019-02-22 ENCOUNTER — Telehealth: Payer: Self-pay | Admitting: Gastroenterology

## 2019-02-22 NOTE — Telephone Encounter (Signed)
Spoke with pt. Reviewed prep instructions and changes in the time of prep with pt and answered her questions. She will call back if she has any further questions. Gwyndolyn Saxon in [facility]

## 2019-02-27 ENCOUNTER — Telehealth: Payer: Self-pay

## 2019-02-27 NOTE — Telephone Encounter (Signed)
Covid-19 screening questions   Do you now or have you had a fever in the last 14 days? NO   Do you have any respiratory symptoms of shortness of breath or cough now or in the last 14 days? NO  Do you have any family members or close contacts with diagnosed or suspected Covid-19 in the past 14 days? NO  Have you been tested for Covid-19 and found to be positive? NO        

## 2019-02-28 ENCOUNTER — Encounter: Payer: Self-pay | Admitting: Gastroenterology

## 2019-02-28 ENCOUNTER — Other Ambulatory Visit: Payer: Self-pay

## 2019-02-28 ENCOUNTER — Ambulatory Visit (AMBULATORY_SURGERY_CENTER): Payer: 59 | Admitting: Gastroenterology

## 2019-02-28 VITALS — BP 116/88 | HR 85 | Temp 98.2°F | Resp 14 | Ht 67.0 in | Wt 134.0 lb

## 2019-02-28 DIAGNOSIS — K635 Polyp of colon: Secondary | ICD-10-CM | POA: Diagnosis not present

## 2019-02-28 DIAGNOSIS — Z8719 Personal history of other diseases of the digestive system: Secondary | ICD-10-CM | POA: Diagnosis not present

## 2019-02-28 DIAGNOSIS — Z86010 Personal history of colonic polyps: Secondary | ICD-10-CM | POA: Diagnosis not present

## 2019-02-28 DIAGNOSIS — D122 Benign neoplasm of ascending colon: Secondary | ICD-10-CM

## 2019-02-28 MED ORDER — SODIUM CHLORIDE 0.9 % IV SOLN: 500.0000 mL | Freq: Once | INTRAVENOUS | Status: AC

## 2019-02-28 NOTE — Progress Notes (Signed)
Temp check by KA/vital check by CW.  Patient stated no changes in medical or surgical history since pre-visit screening on 01/16/19.

## 2019-02-28 NOTE — Progress Notes (Signed)
Called to room to assist during endoscopic procedure.  Patient ID and intended procedure confirmed with present staff. Received instructions for my participation in the procedure from the performing physician.  

## 2019-02-28 NOTE — Progress Notes (Signed)
Pt stable. VSS. Arousable. To recovery.

## 2019-02-28 NOTE — Op Note (Signed)
[facility] Patient Name: Claudia Ellison Procedure Date: 02/28/2019 11:30 AM MRN: [id number] Endoscopist: Mauri Pole , MD Age: 53 Referring MD:  Date of Birth: 1965-07-06 Gender: Female Account #: [id number] Procedure:                Colonoscopy Indications:              High risk colon cancer surveillance: Personal                            history of colonic polyps, Surveillance: Piecemeal                            removal of large sessile adenoma last colonoscopy                            (< 3 yrs) Medicines:                Monitored Anesthesia Care Procedure:                Pre-Anesthesia Assessment:                           - Prior to the procedure, a History and Physical                            was performed, and patient medications and                            allergies were reviewed. The patient's tolerance of                            previous anesthesia was also reviewed. The risks                            and benefits of the procedure and the sedation                            options and risks were discussed with the patient.                            All questions were answered, and informed consent                            was obtained. Prior Anticoagulants: The patient has                            taken no previous anticoagulant or antiplatelet                            agents. ASA Grade Assessment: II - A patient with                            mild systemic disease. After reviewing the risks  and benefits, the patient was deemed in                            satisfactory condition to undergo the procedure.                           After obtaining informed consent, the colonoscope                            was passed under direct vision. Throughout the                            procedure, the patient's blood pressure, pulse, and                            oxygen saturations were monitored continuously.  The                            Colonoscope was introduced through the anus and                            advanced to the the terminal ileum, with                            identification of the appendiceal orifice and IC                            valve. The colonoscopy was performed without                            difficulty. The patient tolerated the procedure                            well. The quality of the bowel preparation was                            excellent. The terminal ileum, ileocecal valve,                            appendiceal orifice, and rectum were photographed. Scope In: 11:37:53 AM Scope Out: 11:53:49 AM Scope Withdrawal Time: 0 hours 8 minutes 1 second  Total Procedure Duration: 0 hours 15 minutes 56 seconds  Findings:                 The perianal and digital rectal examinations were                            normal.                           A 5 mm polyp was found in the ascending colon. The                            polyp was sessile. The polyp was removed with a  cold snare. Resection and retrieval were complete.                           Normal mucosa was found in the entire colon.                            Biopsies were taken with a cold forceps for                            histology.                           Non-bleeding internal hemorrhoids were found during                            retroflexion. The hemorrhoids were small. Complications:            No immediate complications. Estimated Blood Loss:     Estimated blood loss was minimal. Impression:               - One 5 mm polyp in the ascending colon, removed                            with a cold snare. Resected and retrieved.                           - Normal mucosa in the entire examined colon.                            Biopsied.                           - Non-bleeding internal hemorrhoids. Recommendation:           - Patient has a contact number available for                             emergencies. The signs and symptoms of potential                            delayed complications were discussed with the                            patient. Return to normal activities tomorrow.                            Written discharge instructions were provided to the                            patient.                           - Resume previous diet.                           - Continue present medications.                           -  Await pathology results.                           - Repeat colonoscopy in 5 years for surveillance                            based on pathology results. Mauri Pole, MD 02/28/2019 11:57:35 AM This report has been signed electronically.

## 2019-02-28 NOTE — Patient Instructions (Signed)
YOU HAD AN ENDOSCOPIC PROCEDURE TODAY AT THE [facility]:   Refer to the procedure report that was given to you for any specific questions about what was found during the examination.  If the procedure report does not answer your questions, please call your gastroenterologist to clarify.  If you requested that your care partner not be given the details of your procedure findings, then the procedure report has been included in a sealed envelope for you to review at your convenience later.  YOU SHOULD EXPECT: Some feelings of bloating in the abdomen. Passage of more gas than usual.  Walking can help get rid of the air that was put into your GI tract during the procedure and reduce the bloating. If you had a lower endoscopy (such as a colonoscopy or flexible sigmoidoscopy) you may notice spotting of blood in your stool or on the toilet paper. If you underwent a bowel prep for your procedure, you may not have a normal bowel movement for a few days.  Please Note:  You might notice some irritation and congestion in your nose or some drainage.  This is from the oxygen used during your procedure.  There is no need for concern and it should clear up in a day or so.  SYMPTOMS TO REPORT IMMEDIATELY:   Following lower endoscopy (colonoscopy or flexible sigmoidoscopy):  Excessive amounts of blood in the stool  Significant tenderness or worsening of abdominal pains  Swelling of the abdomen that is new, acute  Fever of 100F or higher  For urgent or emergent issues, a gastroenterologist can be reached at any hour by calling [phone].   DIET:  We do recommend a small meal at first, but then you may proceed to your regular diet.  Drink plenty of fluids but you should avoid alcoholic beverages for 24 hours.  ACTIVITY:  You should plan to take it easy for the rest of today and you should NOT DRIVE or use heavy machinery until tomorrow (because of the sedation medicines used during the test).     FOLLOW UP: Our staff will call the number listed on your records 48-72 hours following your procedure to check on you and address any questions or concerns that you may have regarding the information given to you following your procedure. If we do not reach you, we will leave a message.  We will attempt to reach you two times.  During this call, we will ask if you have developed any symptoms of COVID 19. If you develop any symptoms (ie: fever, flu-like symptoms, shortness of breath, cough etc.) before then, please call [phone].  If you test positive for Covid 19 in the 2 weeks post procedure, please call and report this information to us.    If any biopsies were taken you will be contacted by phone or by letter within the next 1-3 weeks.  Please call us at [phone] if you have not heard about the biopsies in 3 weeks.    SIGNATURES/CONFIDENTIALITY: You and/or your care partner have signed paperwork which will be entered into your electronic medical record.  These signatures attest to the fact that that the information above on your After Visit Summary has been reviewed and is understood.  Full responsibility of the confidentiality of this discharge information lies with you and/or your care-partner. 

## 2019-03-02 ENCOUNTER — Telehealth: Payer: Self-pay

## 2019-03-02 NOTE — Telephone Encounter (Signed)
First post procedure follow up call, no answer 

## 2019-03-02 NOTE — Telephone Encounter (Signed)
Attempted to reach patient for post-procedure f/u call. No answer. Left message for her to please not hesitate to call us if she has any questions/concerns regarding her care. 

## 2019-03-08 ENCOUNTER — Encounter: Payer: Self-pay | Admitting: Gastroenterology

## 2019-06-18 ENCOUNTER — Other Ambulatory Visit: Payer: Self-pay | Admitting: Gastroenterology

## 2019-08-16 ENCOUNTER — Ambulatory Visit: Payer: 59 | Attending: Internal Medicine

## 2019-08-16 DIAGNOSIS — Z23 Encounter for immunization: Secondary | ICD-10-CM

## 2019-08-16 NOTE — Progress Notes (Signed)
   Covid-19 Vaccination Clinic  Name:  Zeinab Grandinetti    MRN: [id number] DOB: 02-Jan-1966  08/16/2019  Ms. Pillsbury was observed post Covid-19 immunization for 15 minutes without incident. She was provided with Vaccine Information Sheet and instruction to access the V-Safe system.   Ms. Ostdiek was instructed to call 911 with any severe reactions post vaccine: Marland Kitchen Difficulty breathing  . Swelling of face and throat  . A fast heartbeat  . A bad rash all over body  . Dizziness and weakness   Immunizations Administered    Name Date Dose VIS Date Route   Pfizer COVID-19 Vaccine 08/16/2019  4:16 PM 0.3 mL 05/11/2019 Intramuscular   Manufacturer: [place]   Lot: [id number]   [place]: [id number]

## 2019-09-10 ENCOUNTER — Ambulatory Visit: Payer: 59 | Attending: Internal Medicine

## 2019-09-10 DIAGNOSIS — Z23 Encounter for immunization: Secondary | ICD-10-CM

## 2019-09-10 NOTE — Progress Notes (Signed)
   Covid-19 Vaccination Clinic  Name:  Dariany Graunke    MRN: [id number] DOB: 25-Jul-1965  09/10/2019  Ms. Saxby was observed post Covid-19 immunization for 15 minutes without incident. She was provided with Vaccine Information Sheet and instruction to access the V-Safe system.   Ms. Justo was instructed to call 911 with any severe reactions post vaccine: Marland Kitchen Difficulty breathing  . Swelling of face and throat  . A fast heartbeat  . A bad rash all over body  . Dizziness and weakness   Immunizations Administered    Name Date Dose VIS Date Route   Pfizer COVID-19 Vaccine 09/10/2019  4:24 PM 0.3 mL 05/11/2019 Intramuscular   Manufacturer: [place]   Lot: [id number]   [place]: [id number]

## 2019-11-23 ENCOUNTER — Other Ambulatory Visit: Payer: Self-pay | Admitting: Gastroenterology

## 2019-11-23 NOTE — Telephone Encounter (Signed)
Patient needs office appointment scheduled  Sent 1 refill of sulfasalazine to pts pharmacy

## 2020-01-24 ENCOUNTER — Ambulatory Visit: Payer: 59 | Admitting: Gastroenterology

## 2020-03-07 ENCOUNTER — Telehealth: Payer: Self-pay | Admitting: Gastroenterology

## 2020-03-07 ENCOUNTER — Other Ambulatory Visit: Payer: Self-pay | Admitting: Gastroenterology

## 2020-03-07 NOTE — Telephone Encounter (Signed)
Medicine has been refilled as requested. I was unable to reach her husband to inform him , phone rang and rang. I attached a note for her to call and set up a yearly appointment.

## 2020-03-07 NOTE — Telephone Encounter (Signed)
Pt's spouse is requesting a refill on the pt's AZULFIDINE.  [facility] at the corner of [address], [place].

## 2020-04-29 ENCOUNTER — Ambulatory Visit (INDEPENDENT_AMBULATORY_CARE_PROVIDER_SITE_OTHER): Payer: 59

## 2020-04-29 ENCOUNTER — Encounter: Payer: Self-pay | Admitting: Orthopedic Surgery

## 2020-04-29 ENCOUNTER — Ambulatory Visit (INDEPENDENT_AMBULATORY_CARE_PROVIDER_SITE_OTHER): Payer: 59 | Admitting: Orthopedic Surgery

## 2020-04-29 ENCOUNTER — Ambulatory Visit: Payer: Self-pay

## 2020-04-29 DIAGNOSIS — M79671 Pain in right foot: Secondary | ICD-10-CM

## 2020-04-29 DIAGNOSIS — M21611 Bunion of right foot: Secondary | ICD-10-CM | POA: Diagnosis not present

## 2020-04-29 DIAGNOSIS — M79672 Pain in left foot: Secondary | ICD-10-CM

## 2020-04-29 DIAGNOSIS — M21612 Bunion of left foot: Secondary | ICD-10-CM | POA: Diagnosis not present

## 2020-04-29 NOTE — Progress Notes (Signed)
Office Visit Note   Patient: Claudia Ellison           Date of Birth: Oct 21, 1965           MRN: [id number] Visit Date: 04/29/2020              Requested by: Marton Redwood, MD [address] [place],  [place] [address] PCP: Marton Redwood, MD  Chief Complaint  Patient presents with  . Left Foot - Pain  . Right Foot - Pain      HPI: Patient is a 54 year old woman who presents complaining of increasing bunion pain worse on the left than the right.  Patient states she was able to wear sneakers for a while but now even has pain over the bunion with wearing sneakers.  Assessment & Plan: Visit Diagnoses:  1. Bilateral foot pain   2. Bilateral bunions     Plan: Discussed that we could proceed with a chevron osteotomy and possible Akin osteotomy for the left bunion deformity risk and benefits were discussed including infection neurovascular injury persistent pain need for additional surgery.  Patient states she understands she will call to set this up in January.  Patient does have a appointment with her dermatologist this month and recommended that she have the skin lesion on the dorsum of the left great toe evaluated by dermatology with a possible biopsy.  Follow-Up Instructions: Return if symptoms worsen or fail to improve, for Patient will follow-up 1 week after surgery.   Ortho Exam  Patient is alert, oriented, no adenopathy, well-dressed, normal affect, normal respiratory effort. Examination both feet are neurovascular intact she has good ankle and subtalar motion bilaterally she has good range of motion of the MTP joint of the great toes bilaterally with a mild bunion deformity of the right moderate hallux valgus deformity on the left no hallux rigidus.  There is a small dark lesion over the base of the proximal phalanx left great toe dorsally.  There is no surrounding cellulitis this does not appear to be a scab for a pressure point.  Imaging: XR Foot Complete Left  Result  Date: 04/29/2020 2 view radiographs of the left foot shows a congruent MTP joint of the great toe with a moderate hallux valgus deformity.  XR Foot Complete Right  Result Date: 04/29/2020 2 view radiographs of the right foot shows a congruent MTP joint of the great toe there is a mild bunion deformity.  No images are attached to the encounter.  Labs: No results found for: HGBA1C, ESRSEDRATE, CRP, LABURIC, REPTSTATUS, GRAMSTAIN, CULT, LABORGA   No results found for: ALBUMIN, PREALBUMIN, LABURIC  No results found for: MG No results found for: VD25OH  No results found for: PREALBUMIN No flowsheet data found.   There is no height or weight on file to calculate BMI.  Orders:  Orders Placed This Encounter  Procedures  . XR Foot Complete Left  . XR Foot Complete Right   No orders of the defined types were placed in this encounter.    Procedures: No procedures performed  Clinical Data: No additional findings.  ROS:  All other systems negative, except as noted in the HPI. Review of Systems  Objective: Vital Signs: There were no vitals taken for this visit.  Specialty Comments:  No specialty comments available.  PMFS History: Patient Active Problem List   Diagnosis Date Noted  . Colitis   . Adenomatous colon polyp   . [place] 06/26/2013  . Other reasons for seeking  consultation 02/26/2013   Past Medical History:  Diagnosis Date  . Adenomatous colon polyp   . Allergy   . Anemia   . Colitis   . Constipation    benefiber daily   . Depression     Family History  Problem Relation Age of Onset  . Heart disease Mother   . Colon cancer Neg Hx   . Esophageal cancer Neg Hx   . Stomach cancer Neg Hx   . Rectal cancer Neg Hx   . Colon polyps Neg Hx     Past Surgical History:  Procedure Laterality Date  . COLONOSCOPY  2008  . PARTIAL HYSTERECTOMY  at age 72  . POLYPECTOMY     Social History   Occupational History  . Not on file  Tobacco Use  .  Smoking status: Never Smoker  . Smokeless tobacco: Never Used  Vaping Use  . Vaping Use: Never used  Substance and Sexual Activity  . Alcohol use: Yes    Alcohol/week: 6.0 - 7.0 standard drinks    Types: 6 - 7 Standard drinks or equivalent per week    Comment: 6-7  . Drug use: Never  . Sexual activity: Not on file

## 2020-05-16 ENCOUNTER — Other Ambulatory Visit: Payer: Self-pay | Admitting: Gastroenterology

## 2020-06-25 ENCOUNTER — Encounter: Payer: Self-pay | Admitting: Gastroenterology

## 2020-06-25 ENCOUNTER — Other Ambulatory Visit: Payer: Self-pay

## 2020-06-25 ENCOUNTER — Ambulatory Visit (INDEPENDENT_AMBULATORY_CARE_PROVIDER_SITE_OTHER): Payer: 59 | Admitting: Gastroenterology

## 2020-06-25 VITALS — BP 140/88 | HR 100 | Wt 142.4 lb

## 2020-06-25 DIAGNOSIS — K5904 Chronic idiopathic constipation: Secondary | ICD-10-CM

## 2020-06-25 DIAGNOSIS — K5120 Ulcerative (chronic) proctitis without complications: Secondary | ICD-10-CM

## 2020-06-25 MED ORDER — SULFASALAZINE 500 MG PO TABS
500.0000 mg | ORAL_TABLET | Freq: Two times a day (BID) | ORAL | 1 refills | Status: DC
Start: 2020-06-25 — End: 2020-09-02

## 2020-06-25 NOTE — Progress Notes (Signed)
Claudia Ellison    [id number]    January 25, 1966  Primary Care Physician:Shaw, Gwyndolyn Saxon, MD  Referring Physician: Marton Redwood, MD [address] [place],  [place] [address]   Chief complaint: Ulcerative colitis  HPI:  55 year old very pleasant female here for follow-up visit for ulcerative colitis.  She is currently asymptomatic.  She is taking sulfasalazine 1 g twice daily Denies any nausea, vomiting, abdominal pain, melena or bright red blood per rectum.  She has intermittent constipation, uses MiraLAX as needed.  On average she has a bowel movement once every other day.  Colonoscopy February 28, 2019: 5 mm sessile serrated polyp removed from the ascending colon.  Rest of the colon appeared normal, biopsies negative for active inflammation.  Internal hemorrhoids.  Colonoscopy December 31, 2007:Sessile polyps (sessile serrated adenoma)greater than 1 cm in the right colon removed piecemeal, mild colitis in the rectosigmoid area scattered erosions and erythema. Biopsies suggestive of acute colitis. Differential includes acute ischemia versus NSAID'sversus infectious versus early IBD     Outpatient Encounter Medications as of 06/25/2020  Medication Sig  . buPROPion (WELLBUTRIN XL) 300 MG 24 hr tablet Take 300 mg by mouth daily.  . Multiple Vitamin (MULTIVITAMIN) tablet Take 1 tablet by mouth daily.  . [DISCONTINUED] sulfaSALAzine (AZULFIDINE) 500 MG tablet TAKE 2 TABLETS(1000 MG) BY MOUTH TWICE DAILY  . Polyethylene Glycol 3350 (MIRALAX PO) Take by mouth. 119 grams for 2 day colon prep (Patient not taking: Reported on 06/25/2020)  . sulfaSALAzine (AZULFIDINE) 500 MG tablet Take 1 tablet (500 mg total) by mouth 2 (two) times daily.  . [DISCONTINUED] bisacodyl (DULCOLAX) 5 MG EC tablet Take 5 mg by mouth daily as needed for moderate constipation. X 4 tabs (Patient not taking: Reported on 06/25/2020)  . [DISCONTINUED] estradiol (ESTRACE) 2 MG tablet Take 2 mg by mouth  daily. (Patient not taking: Reported on 06/25/2020)  . [DISCONTINUED] mesalamine (APRISO) 0.375 g 24 hr capsule Take 2 capsules (0.75 g total) by mouth daily. (Patient not taking: Reported on 06/25/2020)   Facility-Administered Encounter Medications as of 06/25/2020  Medication  . 0.9 %  sodium chloride infusion    Allergies as of 06/25/2020  . (No Known Allergies)    Past Medical History:  Diagnosis Date  . Adenomatous colon polyp   . Allergy   . Anemia   . Colitis   . Constipation    benefiber daily   . Depression     Past Surgical History:  Procedure Laterality Date  . COLONOSCOPY  2008  . PARTIAL HYSTERECTOMY  at age 66  . POLYPECTOMY      Family History  Problem Relation Age of Onset  . Heart disease Mother   . Colon cancer Neg Hx   . Esophageal cancer Neg Hx   . Stomach cancer Neg Hx   . Rectal cancer Neg Hx   . Colon polyps Neg Hx     Social History   Socioeconomic History  . Marital status: Married    Spouse name: Not on file  . Number of children: Not on file  . Years of education: Not on file  . Highest education level: Not on file  Occupational History  . Not on file  Tobacco Use  . Smoking status: Never Smoker  . Smokeless tobacco: Never Used  Vaping Use  . Vaping Use: Never used  Substance and Sexual Activity  . Alcohol use: Yes    Alcohol/week: 6.0 - 7.0 standard  drinks    Types: 6 - 7 Standard drinks or equivalent per week    Comment: 6-7  . Drug use: Never  . Sexual activity: Not on file  Other Topics Concern  . Not on file  Social History Narrative  . Not on file   Social Determinants of Health   Financial Resource Strain: Not on file  Food Insecurity: Not on file  Transportation Needs: Not on file  Physical Activity: Not on file  Stress: Not on file  Social Connections: Not on file  Intimate Partner Violence: Not on file      Review of systems: All other review of systems negative except as mentioned in the  HPI.   Physical Exam: Vitals:   06/25/20 1000  BP: 140/88  Pulse: 100  SpO2: 94%   Body mass index is 22.3 kg/m. Gen:      No acute distress HEENT:  sclera anicteric Abd:      soft, non-tender; no palpable masses, no distension Ext:    No edema Neuro: alert and oriented x 3 Psych: normal mood and affect  Data Reviewed:  Reviewed labs, radiology imaging, old records and pertinent past GI work up   Assessment and Plan/Recommendations:  55 year old very pleasant female with history of ulcerative proctosigmoiditis  She had mild acute inflammation in rectosigmoid colon in 2009, unclear if it was related to NSAIDs versus infection versus ischemia She has remained asymptomatic, has been taking mesalamine and more recently sulfasalazine  Recent colonoscopy in 2020 was negative for any active inflammation  Will plan to taper down sulfasalazine dose and completely wean off as tolerated Decrease sulfasalazine dose to 500 mg twice daily for 2 weeks and then 500 mg daily for 2 weeks and stop  She has follow-up appointment with Dr. Brigitte Pulse in April and is due for her annual labs.  We will request Dr. Brigitte Pulse to order CBC, CMP, iron panel with ferritin, B12, folate, ESR and CRP for IBD health maintenance  She is up-to-date with immunizations  Continue to avoid NSAIDs  Chronic idiopathic constipation: Use Benefiber 1 tablespoon twice daily with meals Okay to continue to use MiraLAX 1 capful daily as needed Increase dietary fiber and water intake  History of adenomatous colon polyps: Due for surveillance colonoscopy in September 2025  Return in 1 year or sooner if needed  This visit required 30 minutes of patient care (this includes precharting, chart review, review of results, face-to-face time used for counseling as well as treatment plan and follow-up. The patient was provided an opportunity to ask questions and all were answered. The patient agreed with the plan and demonstrated an  understanding of the instructions.  Damaris Hippo , MD    CC: Marton Redwood, MD

## 2020-06-25 NOTE — Patient Instructions (Signed)
You will need these labs drawn from your PCP when he draws your labs ( CBC w Cdiff, CMET, ESR, CRP, B12, Folate) Please have your doctor fax the results to Dr Silverio Decamp at [phone]  Follow up in 1 year  Sulfasalazine taper:   1 tablet twice a day for 2 weeks then decrease to 1 tablet daily for 2 weeks then STOP  Take Benefiber 1 tablespoon twice daily  Take Miralax 1 capful daily as needed  Due to recent changes in healthcare laws, you may see the results of your imaging and laboratory studies on MyChart before your provider has had a chance to review them.  We understand that in some cases there may be results that are confusing or concerning to you. Not all laboratory results come back in the same time frame and the provider may be waiting for multiple results in order to interpret others.  Please give [place] 48 hours in order for your provider to thoroughly review all the results before contacting the office for clarification of your results.   If you are age 66 or older, your body mass index should be between 23-30. Your Body mass index is 22.3 kg/m. If this is out of the aforementioned range listed, please consider follow up with your Primary Care Provider.  If you are age 84 or younger, your body mass index should be between 19-25. Your Body mass index is 22.3 kg/m. If this is out of the aformentioned range listed, please consider follow up with your Primary Care Provider.    I appreciate the  opportunity to care for you  Thank You   Harl Bowie , MD

## 2020-07-08 ENCOUNTER — Ambulatory Visit (HOSPITAL_BASED_OUTPATIENT_CLINIC_OR_DEPARTMENT_OTHER): Admission: RE | Admit: 2020-07-08 | Payer: 59 | Source: Home / Self Care | Admitting: Orthopedic Surgery

## 2020-07-08 ENCOUNTER — Encounter (HOSPITAL_BASED_OUTPATIENT_CLINIC_OR_DEPARTMENT_OTHER): Admission: RE | Payer: Self-pay | Source: Home / Self Care

## 2020-07-08 SURGERY — OSTEOTOMY, WEIL
Anesthesia: Choice | Laterality: Left

## 2020-09-02 ENCOUNTER — Other Ambulatory Visit: Payer: Self-pay

## 2020-09-02 ENCOUNTER — Ambulatory Visit (INDEPENDENT_AMBULATORY_CARE_PROVIDER_SITE_OTHER): Payer: 59

## 2020-09-02 ENCOUNTER — Ambulatory Visit (INDEPENDENT_AMBULATORY_CARE_PROVIDER_SITE_OTHER): Payer: 59 | Admitting: Podiatry

## 2020-09-02 ENCOUNTER — Encounter: Payer: Self-pay | Admitting: Podiatry

## 2020-09-02 DIAGNOSIS — M21622 Bunionette of left foot: Secondary | ICD-10-CM | POA: Diagnosis not present

## 2020-09-02 DIAGNOSIS — M2012 Hallux valgus (acquired), left foot: Secondary | ICD-10-CM

## 2020-09-02 DIAGNOSIS — M85872 Other specified disorders of bone density and structure, left ankle and foot: Secondary | ICD-10-CM

## 2020-09-02 NOTE — Progress Notes (Signed)
  Subjective:  Patient ID: Claudia Ellison, female    DOB: 1965/10/11,  MRN: [id number]  Chief Complaint  Patient presents with  . Foot Pain    1st and 5th MPJ left - bunion deformities x years, starting to get more uncomfortable, wanting a lapiplasty, 2nd opinion  . New Patient (Initial Visit)    55 y.o. female presents with the above complaint. History confirmed with patient.  She is referred to me by Dr. Milinda Pointer, she is here with her husband today.  She has painful bunions on the left side both medially and laterally.  Worse in shoe gear.  Objective:  Physical Exam: warm, good capillary refill, no trophic changes or ulcerative lesions, normal DP and PT pulses and normal sensory exam. Left Foot: She has hallux valgus deformity, with hypermobility of the first ray as well as tailor's bunion deformity both have tenderness and erythema over the bony eminences   Radiographs: X-ray of the left foot: Hallux valgus metatarsus primus varus and valgus rotation, she has tailor's bunion as well, diffuse osteopenia Assessment:   1. Acquired hallux valgus of left foot   2. Tailor's bunionette, left   3. Osteopenia of left foot      Plan:  Patient was evaluated and treated and all questions answered.  Discussed the etiology and treatment including surgical and non surgical treatment for painful bunions and tailor's bunions.  She has exhausted all non surgical treatment prior to this visit including shoe gear changes and padding.  She desires surgical intervention. We discussed all risks including but not limited to: pain, swelling, infection, scar, numbness which may be temporary or permanent, chronic pain, stiffness, nerve pain or damage, wound healing problems, bone healing problems including delayed or non-union and recurrence. Specifically we discussed the following procedures: Left foot Lapidus bunionectomy with tailor's bunion correction with osteotomy and autograft from the left heel.  Informed consent was signed today. Surgery will be scheduled at a mutually agreeable date. Information regarding this will be forwarded to our surgery scheduler.   Surgical plan:  Procedure: -Lapidus bunionectomy with tailor's bunion correction with osteotomy and autograft from the left heel  Location: -[facility]  Anesthesia plan: -General anesthesia with regional block  Postoperative pain plan: - Tylenol 1000 mg every 6 hours, ibuprofen 600 mg every 6 hours, gabapentin 300 mg every 8 hours x5 days, oxycodone 5 mg 1-2 tabs every 6 hours only as needed  DVT prophylaxis: -ASA 325 mg  WB Restrictions / DME needs: -NWB in CAM boot which was dispensed today  No follow-ups on file.

## 2020-09-29 DIAGNOSIS — M859 Disorder of bone density and structure, unspecified: Secondary | ICD-10-CM | POA: Diagnosis not present

## 2020-09-29 DIAGNOSIS — Z0000 Encounter for general adult medical examination without abnormal findings: Secondary | ICD-10-CM | POA: Diagnosis not present

## 2020-09-29 DIAGNOSIS — R7989 Other specified abnormal findings of blood chemistry: Secondary | ICD-10-CM | POA: Diagnosis not present

## 2020-10-02 DIAGNOSIS — R82998 Other abnormal findings in urine: Secondary | ICD-10-CM | POA: Diagnosis not present

## 2020-10-02 DIAGNOSIS — Z1339 Encounter for screening examination for other mental health and behavioral disorders: Secondary | ICD-10-CM | POA: Diagnosis not present

## 2020-10-02 DIAGNOSIS — R030 Elevated blood-pressure reading, without diagnosis of hypertension: Secondary | ICD-10-CM | POA: Diagnosis not present

## 2020-10-02 DIAGNOSIS — Z0000 Encounter for general adult medical examination without abnormal findings: Secondary | ICD-10-CM | POA: Diagnosis not present

## 2020-10-02 DIAGNOSIS — Z1331 Encounter for screening for depression: Secondary | ICD-10-CM | POA: Diagnosis not present

## 2020-10-02 DIAGNOSIS — Z23 Encounter for immunization: Secondary | ICD-10-CM | POA: Diagnosis not present

## 2020-10-08 DIAGNOSIS — T300 Burn of unspecified body region, unspecified degree: Secondary | ICD-10-CM | POA: Diagnosis not present

## 2020-10-10 ENCOUNTER — Telehealth: Payer: Self-pay

## 2020-10-10 DIAGNOSIS — H524 Presbyopia: Secondary | ICD-10-CM | POA: Diagnosis not present

## 2020-10-10 DIAGNOSIS — H52223 Regular astigmatism, bilateral: Secondary | ICD-10-CM | POA: Diagnosis not present

## 2020-10-10 DIAGNOSIS — H5203 Hypermetropia, bilateral: Secondary | ICD-10-CM | POA: Diagnosis not present

## 2020-10-10 NOTE — Telephone Encounter (Signed)
Claudia Ellison called to cancel her surgery with Dr. Sherryle Lis on 10/31/2020. She stated she is having some issues with her insurance. Once she get everything figured out, she will call back to reschedule. Notified Dr. Sherryle Lis and Caren Griffins with [facility].

## 2020-11-05 ENCOUNTER — Other Ambulatory Visit: Payer: Self-pay | Admitting: Physician Assistant

## 2020-11-06 ENCOUNTER — Encounter: Payer: 59 | Admitting: Podiatry

## 2020-11-13 ENCOUNTER — Encounter: Payer: 59 | Admitting: Podiatry

## 2020-11-24 ENCOUNTER — Other Ambulatory Visit: Payer: Self-pay | Admitting: Physician Assistant

## 2020-11-27 ENCOUNTER — Encounter: Payer: 59 | Admitting: Podiatry

## 2020-12-02 ENCOUNTER — Ambulatory Visit (HOSPITAL_BASED_OUTPATIENT_CLINIC_OR_DEPARTMENT_OTHER): Admission: RE | Admit: 2020-12-02 | Payer: 59 | Source: Home / Self Care | Admitting: Orthopedic Surgery

## 2020-12-02 ENCOUNTER — Encounter (HOSPITAL_BASED_OUTPATIENT_CLINIC_OR_DEPARTMENT_OTHER): Admission: RE | Payer: Self-pay | Source: Home / Self Care

## 2020-12-02 SURGERY — OSTEOTOMY, WEIL
Anesthesia: Choice | Laterality: Left

## 2020-12-18 DIAGNOSIS — D1801 Hemangioma of skin and subcutaneous tissue: Secondary | ICD-10-CM | POA: Diagnosis not present

## 2020-12-18 DIAGNOSIS — L814 Other melanin hyperpigmentation: Secondary | ICD-10-CM | POA: Diagnosis not present

## 2020-12-18 DIAGNOSIS — L821 Other seborrheic keratosis: Secondary | ICD-10-CM | POA: Diagnosis not present

## 2020-12-18 DIAGNOSIS — D1723 Benign lipomatous neoplasm of skin and subcutaneous tissue of right leg: Secondary | ICD-10-CM | POA: Diagnosis not present

## 2021-02-19 ENCOUNTER — Ambulatory Visit (INDEPENDENT_AMBULATORY_CARE_PROVIDER_SITE_OTHER): Payer: Self-pay | Admitting: Podiatry

## 2021-02-19 ENCOUNTER — Other Ambulatory Visit: Payer: Self-pay

## 2021-02-19 DIAGNOSIS — M2012 Hallux valgus (acquired), left foot: Secondary | ICD-10-CM

## 2021-02-19 DIAGNOSIS — M85872 Other specified disorders of bone density and structure, left ankle and foot: Secondary | ICD-10-CM

## 2021-02-19 DIAGNOSIS — M21622 Bunionette of left foot: Secondary | ICD-10-CM

## 2021-02-24 ENCOUNTER — Encounter: Payer: Self-pay | Admitting: Podiatry

## 2021-02-24 NOTE — Progress Notes (Signed)
  Subjective:  Patient ID: Claudia Ellison, female    DOB: 1966/05/27,  MRN: [id number]  Chief Complaint  Patient presents with   Follow-up    Surgery consult    55 y.o. female presents with the above complaint. History confirmed with patient.  She returns for follow-up and is ready to reschedule her surgery  Objective:  Physical Exam: warm, good capillary refill, no trophic changes or ulcerative lesions, normal DP and PT pulses and normal sensory exam. Left Foot: She has hallux valgus deformity, with hypermobility of the first ray as well as tailor's bunion deformity both have tenderness and erythema over the bony eminences   Radiographs: X-ray of the left foot: Hallux valgus metatarsus primus varus and valgus rotation, she has tailor's bunion as well, diffuse osteopenia Assessment:   No diagnosis found.    Plan:  Patient was evaluated and treated and all questions answered.  Discussed the etiology and treatment including surgical and non surgical treatment for painful bunions and tailor's bunions.  She has exhausted all non surgical treatment prior to this visit including shoe gear changes and padding.  She desires surgical intervention. We discussed all risks including but not limited to: pain, swelling, infection, scar, numbness which may be temporary or permanent, chronic pain, stiffness, nerve pain or damage, wound healing problems, bone healing problems including delayed or non-union and recurrence. Specifically we discussed the following procedures: Left foot Lapidus bunionectomy with tailor's bunion correction with osteotomy and autograft from the left heel. Informed consent was signed today. Surgery will be scheduled at a mutually agreeable date. Information regarding this will be forwarded to our surgery scheduler.  Reprinted her vitamin D level to be checked before surgery.  She will return in December for presurgical planning visit and dispense her boot at that time and  answer any final questions  Surgical plan:  Procedure: -Lapidus bunionectomy with tailor's bunion correction with osteotomy and autograft from the left heel  Location: -[facility]  Anesthesia plan: -General anesthesia with regional block  Postoperative pain plan: - Tylenol 1000 mg every 6 hours, ibuprofen 600 mg every 6 hours, gabapentin 300 mg every 8 hours x5 days, oxycodone 5 mg 1-2 tabs every 6 hours only as needed  DVT prophylaxis: -ASA 325 mg  WB Restrictions / DME needs: -NWB in CAM boot which was dispensed today  Return in about 12 weeks (around 05/14/2021) for pre surgery planning visit .

## 2021-05-15 ENCOUNTER — Telehealth: Payer: Self-pay | Admitting: Podiatry

## 2021-05-15 ENCOUNTER — Other Ambulatory Visit: Payer: Self-pay | Admitting: Podiatry

## 2021-05-15 DIAGNOSIS — M2012 Hallux valgus (acquired), left foot: Secondary | ICD-10-CM | POA: Diagnosis not present

## 2021-05-15 NOTE — Telephone Encounter (Signed)
DOS: 06/05/2021  [place] Osteotomy Left - 28310 Lapidus Procedure Including Bunionectomy Left - 06015 Metatarsal Osteotomy 5th Left - 28308 Bone Graft Left - 20900  BCBS Effective: 08/29/2020  Deductible: $3,000 with $0 remaining. Out of Pocket: $3,000 with $0 remaining. CoInsurance: 0% Copay: $0  Per [employer] Prior Authorization is NOT required.

## 2021-05-16 LAB — CALCIUM: Calcium: 9.8 mg/dL (ref 8.7–10.2)

## 2021-05-16 LAB — VITAMIN D 25 HYDROXY (VIT D DEFICIENCY, FRACTURES): Vit D, 25-Hydroxy: 70.9 ng/mL (ref 30.0–100.0)

## 2021-05-21 ENCOUNTER — Ambulatory Visit (INDEPENDENT_AMBULATORY_CARE_PROVIDER_SITE_OTHER): Payer: BC Managed Care – PPO | Admitting: Podiatry

## 2021-05-21 ENCOUNTER — Other Ambulatory Visit: Payer: Self-pay

## 2021-05-21 DIAGNOSIS — M21622 Bunionette of left foot: Secondary | ICD-10-CM

## 2021-05-21 DIAGNOSIS — M2012 Hallux valgus (acquired), left foot: Secondary | ICD-10-CM

## 2021-05-21 NOTE — Progress Notes (Signed)
°  Subjective:  Patient ID: Claudia Ellison, female    DOB: 01-31-1966,  MRN: [id number]  Chief Complaint  Patient presents with   Surgery Consultation    Pre-Op for left foot bunion. Date set for 06/05/2021. Cam boot discpursed in office today.     55 y.o. female presents with the above complaint. History confirmed with patient.  Here today for presurgical planning visit and to address final questions.  She completed the lab work  Objective:  Physical Exam: warm, good capillary refill, no trophic changes or ulcerative lesions, normal DP and PT pulses and normal sensory exam. Left Foot: She has hallux valgus deformity, with hypermobility of the first ray as well as tailor's bunion deformity both have tenderness and erythema over the bony eminences   Radiographs: X-ray of the left foot: Hallux valgus metatarsus primus varus and valgus rotation, she has tailor's bunion as well, diffuse osteopenia  Vitamin D within normal limits Assessment:   1. Acquired hallux valgus of left foot   2. Tailor's bunionette, left      Plan:  Patient was evaluated and treated and all questions answered.  Discussed the etiology and treatment including surgical and non surgical treatment for painful bunions and tailor's bunions.  She has exhausted all non surgical treatment prior to this visit including shoe gear changes and padding.  She desires surgical intervention. We discussed all risks including but not limited to: pain, swelling, infection, scar, numbness which may be temporary or permanent, chronic pain, stiffness, nerve pain or damage, wound healing problems, bone healing problems including delayed or non-union and recurrence. Specifically we discussed the following procedures: Left foot Lapidus bunionectomy with tailor's bunion correction with osteotomy and autograft from the left heel, possible Akin osteotomy. Informed consent was signed today. Surgery will be scheduled at a mutually agreeable date.  Information regarding this will be forwarded to our surgery scheduler.   Surgical plan:  Procedure: -Lapidus bunionectomy with tailor's bunion correction with osteotomy and autograft from the left heel, possible Akin osteotomy  Location: -[facility]  Anesthesia plan: -General anesthesia with regional block  Postoperative pain plan: - Tylenol 1000 mg every 6 hours, ibuprofen 600 mg every 6 hours, gabapentin 300 mg every 8 hours x5 days, oxycodone 5 mg 1-2 tabs every 6 hours only as needed  DVT prophylaxis: -None required  WB Restrictions / DME needs: -NWB in CAM boot which was dispensed today  Return for after surgery .

## 2021-05-31 HISTORY — PX: BUNIONECTOMY: SHX129

## 2021-06-05 ENCOUNTER — Other Ambulatory Visit: Payer: Self-pay | Admitting: Podiatry

## 2021-06-05 ENCOUNTER — Encounter: Payer: Self-pay | Admitting: Podiatry

## 2021-06-05 DIAGNOSIS — M205X2 Other deformities of toe(s) (acquired), left foot: Secondary | ICD-10-CM | POA: Diagnosis not present

## 2021-06-05 DIAGNOSIS — M2042 Other hammer toe(s) (acquired), left foot: Secondary | ICD-10-CM | POA: Diagnosis not present

## 2021-06-05 DIAGNOSIS — M21622 Bunionette of left foot: Secondary | ICD-10-CM | POA: Diagnosis not present

## 2021-06-05 DIAGNOSIS — M2012 Hallux valgus (acquired), left foot: Secondary | ICD-10-CM | POA: Diagnosis not present

## 2021-06-05 DIAGNOSIS — M89772 Major osseous defect, left ankle and foot: Secondary | ICD-10-CM | POA: Diagnosis not present

## 2021-06-05 DIAGNOSIS — G8918 Other acute postprocedural pain: Secondary | ICD-10-CM | POA: Diagnosis not present

## 2021-06-05 MED ORDER — OXYCODONE HCL 5 MG PO TABS: 5.0000 mg | ORAL_TABLET | ORAL | 0 refills | Status: AC | PRN

## 2021-06-05 MED ORDER — OXYCODONE HCL 5 MG PO TABS: 5.0000 mg | ORAL_TABLET | ORAL | 0 refills | Status: DC | PRN

## 2021-06-05 MED ORDER — GABAPENTIN 300 MG PO CAPS: 300.0000 mg | ORAL_CAPSULE | Freq: Three times a day (TID) | ORAL | 0 refills | Status: DC

## 2021-06-05 MED ORDER — ACETAMINOPHEN 500 MG PO TABS: 1000.0000 mg | ORAL_TABLET | Freq: Four times a day (QID) | ORAL | 0 refills | Status: AC | PRN

## 2021-06-05 MED ORDER — IBUPROFEN 600 MG PO TABS: 600.0000 mg | ORAL_TABLET | Freq: Four times a day (QID) | ORAL | 0 refills | Status: AC | PRN

## 2021-06-05 MED ORDER — IBUPROFEN 600 MG PO TABS: 600.0000 mg | ORAL_TABLET | Freq: Four times a day (QID) | ORAL | 0 refills | Status: DC | PRN

## 2021-06-05 MED ORDER — ACETAMINOPHEN 500 MG PO TABS: 1000.0000 mg | ORAL_TABLET | Freq: Four times a day (QID) | ORAL | 0 refills | Status: DC | PRN

## 2021-06-05 NOTE — Progress Notes (Signed)
Resent post-op medications to updated pharmacy.

## 2021-06-05 NOTE — Progress Notes (Signed)
06/05/21 left foot Lapidus bunionectomy, Akin osteotomy, bone graft

## 2021-06-08 ENCOUNTER — Telehealth: Payer: Self-pay | Admitting: Podiatry

## 2021-06-08 NOTE — Telephone Encounter (Signed)
Patient's husband called on Friday, June 05, 2021 saying the prescriptions were sent for long pharmacy.  I updated the pharmacy and recent prescriptions.  Patient has been called back on Saturday, June 06, 2021 asked about pain management.  The patient had taken the Tylenol as well as gabapentin but still having pain.  She last took it medication about 2 hours ago and is asking if she can take the oxycodone.  Recommended to take that now and then keep on alternate with ibuprofen, Tylenol and use the oxycodone as needed as well.  Continue ice only.  Encouraged to call back with any questions or concerns or any changes.

## 2021-06-11 ENCOUNTER — Other Ambulatory Visit: Payer: Self-pay

## 2021-06-11 ENCOUNTER — Ambulatory Visit (INDEPENDENT_AMBULATORY_CARE_PROVIDER_SITE_OTHER): Payer: Self-pay | Admitting: Podiatry

## 2021-06-11 ENCOUNTER — Ambulatory Visit (INDEPENDENT_AMBULATORY_CARE_PROVIDER_SITE_OTHER): Payer: BC Managed Care – PPO

## 2021-06-11 DIAGNOSIS — M2012 Hallux valgus (acquired), left foot: Secondary | ICD-10-CM | POA: Diagnosis not present

## 2021-06-11 NOTE — Patient Instructions (Signed)
On Monday 1/16 you may remove the bandage and take a shower. Do not soak the foot. Reapply the ACE wrap and toe spreader   On Monday 1/23 you may begin putting weight on the foot only in the boot

## 2021-06-14 NOTE — Progress Notes (Signed)
°  Subjective:  Patient ID: Claudia Ellison, female    DOB: [birth year],  MRN: [id number]  Chief Complaint  Patient presents with   Routine Post Op    POV #1 DOS 06/05/2021 BUNIONECTOMY OF LT FOOT W/LAPIDUS PROC, CUT OF BONE IN BIG TOE(AKIN PROC), BONE GRAFT FROM HEEL, TAILORS BUNION CORRECTION  PT stated that she is doing well her pain is controlled with meds  Denies any fever chills nausea or vomitting      56 y.o. female returns for post-op check.  Overall doing well pain is improving quite a bit  Review of Systems: Negative except as noted in the HPI. Denies N/V/F/Ch.   Objective:  There were no vitals filed for this visit. There is no height or weight on file to calculate BMI. Constitutional Well developed. Well nourished.  Vascular Foot warm and well perfused. Capillary refill normal to all digits.  Calf is soft and supple, no posterior calf or knee pain, negative Homans' sign  Neurologic Normal speech. Oriented to person, place, and time. Epicritic sensation to light touch grossly present bilaterally.  Dermatologic Skin healing well without signs of infection. Skin edges well coapted without signs of infection.  Orthopedic: Tenderness to palpation noted about the surgical site.   Multiple view plain film radiographs: Status post Lapidus and Akin bunionectomy with bone graft from heel and tailor's bunionectomy of the left foot all hardware in good position alignment is maintained Assessment:   1. Acquired hallux valgus of left foot    Plan:  Patient was evaluated and treated and all questions answered.  S/p foot surgery left -Progressing as expected post-operatively. -XR: As noted above -WB Status: NWB in CAM boot with scooter may begin in 10 days weightbearing -Sutures: Removed at next visit. -Medications: No refills required -Foot redressed.  May remove on Monday begin bathing  Return in about 2 weeks (around 06/25/2021) for post op (no x-rays), suture removal.

## 2021-06-23 ENCOUNTER — Ambulatory Visit (INDEPENDENT_AMBULATORY_CARE_PROVIDER_SITE_OTHER): Payer: BC Managed Care – PPO | Admitting: Podiatry

## 2021-06-23 ENCOUNTER — Other Ambulatory Visit: Payer: Self-pay

## 2021-06-23 DIAGNOSIS — M2012 Hallux valgus (acquired), left foot: Secondary | ICD-10-CM

## 2021-06-23 DIAGNOSIS — Z9889 Other specified postprocedural states: Secondary | ICD-10-CM

## 2021-06-23 DIAGNOSIS — M21622 Bunionette of left foot: Secondary | ICD-10-CM

## 2021-06-24 NOTE — Progress Notes (Signed)
°  Subjective:  Patient ID: Claudia Ellison, female    DOB: [birth year],  MRN: [id number]  Chief Complaint  Patient presents with   Routine Post Op    Pov 2. Suture removal      56 y.o. female returns for post-op check.  Overall doing well pain is improving quite a bit  Review of Systems: Negative except as noted in the HPI. Denies N/V/F/Ch.   Objective:  There were no vitals filed for this visit. There is no height or weight on file to calculate BMI. Constitutional Well developed. Well nourished.  Vascular Foot warm and well perfused. Capillary refill normal to all digits.  Calf is soft and supple, no posterior calf or knee pain, negative Homans' sign  Neurologic Normal speech. Oriented to person, place, and time. Epicritic sensation to light touch grossly present bilaterally.  Dermatologic Skin healing well without signs of infection. Skin edges well coapted without signs of infection.  Moderate edema  Orthopedic: Tenderness to palpation noted about the surgical site.   Multiple view plain film radiographs: Status post Lapidus and Akin bunionectomy with bone graft from heel and tailor's bunionectomy of the left foot all hardware in good position alignment is maintained Assessment:   1. Acquired hallux valgus of left foot   2. Tailor's bunionette, left   3. Post-operative state    Plan:  Patient was evaluated and treated and all questions answered.  S/p foot surgery left -Progressing as expected post-operatively. -XR: As noted above -WB Status: WBAT in CAM boot still swollen but not much pain -Sutures: Removed today -Medications: No refills required -Compression sleeve applied today with toe spacer continue wearing until next visit to reduce edema  Return in about 3 weeks (around 07/14/2021) for post op (new x-rays).

## 2021-06-25 ENCOUNTER — Telehealth: Payer: Self-pay | Admitting: *Deleted

## 2021-06-25 ENCOUNTER — Encounter: Payer: Self-pay | Admitting: Podiatry

## 2021-06-25 NOTE — Telephone Encounter (Signed)
Patient's husband is calling because the separators placed on left great toe at office visit 1 day ago is too tight, causing severe pain, losing circulation. He has removed and has stuck in between the great toe and 2nd toe. Is this ok?Please advise.

## 2021-06-25 NOTE — Progress Notes (Signed)
DOS  06/05/2021 Lapidus procedure including bunionectomy left foot Aiken osteotomy left foot Metatarsal osteotomy left fifth toe Bone graft left heel

## 2021-06-25 NOTE — Telephone Encounter (Signed)
Returned the call to patient, giving information per physician, verbalized understanding, said that today after taking shower, redressed the toe the way done in office and it went on and feels fine.

## 2021-07-06 ENCOUNTER — Telehealth: Payer: Self-pay | Admitting: Podiatry

## 2021-07-06 NOTE — Telephone Encounter (Signed)
Patient lost her toe separator , what size does she need to come in office and buy replacement ?

## 2021-07-06 NOTE — Telephone Encounter (Signed)
Patient's husband is calling to get more toe separators, misplaced ones given. He will come to office  today to purchase.

## 2021-07-14 DIAGNOSIS — Z01419 Encounter for gynecological examination (general) (routine) without abnormal findings: Secondary | ICD-10-CM | POA: Diagnosis not present

## 2021-07-14 DIAGNOSIS — Z1231 Encounter for screening mammogram for malignant neoplasm of breast: Secondary | ICD-10-CM | POA: Diagnosis not present

## 2021-07-14 DIAGNOSIS — Z6822 Body mass index (BMI) 22.0-22.9, adult: Secondary | ICD-10-CM | POA: Diagnosis not present

## 2021-07-16 ENCOUNTER — Ambulatory Visit (INDEPENDENT_AMBULATORY_CARE_PROVIDER_SITE_OTHER): Payer: BC Managed Care – PPO

## 2021-07-16 ENCOUNTER — Encounter: Payer: Self-pay | Admitting: Podiatry

## 2021-07-16 ENCOUNTER — Other Ambulatory Visit: Payer: Self-pay

## 2021-07-16 ENCOUNTER — Ambulatory Visit (INDEPENDENT_AMBULATORY_CARE_PROVIDER_SITE_OTHER): Payer: BC Managed Care – PPO | Admitting: Podiatry

## 2021-07-16 DIAGNOSIS — M21622 Bunionette of left foot: Secondary | ICD-10-CM

## 2021-07-16 DIAGNOSIS — Z9889 Other specified postprocedural states: Secondary | ICD-10-CM

## 2021-07-16 DIAGNOSIS — M2012 Hallux valgus (acquired), left foot: Secondary | ICD-10-CM

## 2021-07-16 NOTE — Progress Notes (Signed)
Subjective:  Patient ID: Claudia Ellison, female    DOB: 11/26/1965,  MRN: [id number]  Chief Complaint  Patient presents with   Routine Post Op     (xray)POV #3 DOS 06/05/2021 BUNIONECTOMY OF LT FOOT W/LAPIDUS PROC, CUT OF BONE IN BIG TOE(AKIN PROC), BONE GRAFT FROM HEEL, TAILORS BUNION CORRECTION     56 y.o. female returns for post-op check.  Overall doing well still quite swollen but not having much pain  Review of Systems: Negative except as noted in the HPI. Denies N/V/F/Ch.   Objective:  There were no vitals filed for this visit. There is no height or weight on file to calculate BMI. Constitutional Well developed. Well nourished.  Vascular Foot warm and well perfused. Capillary refill normal to all digits.  Calf is soft and supple, no posterior calf or knee pain, negative Homans' sign  Neurologic Normal speech. Oriented to person, place, and time. Epicritic sensation to light touch grossly present bilaterally.  Dermatologic Skin healing well without signs of infection. Skin edges well coapted without signs of infection.  Moderate edema  Orthopedic: Tenderness to palpation noted about the surgical site.   Multiple view plain film radiographs: Status post Lapidus and Akin bunionectomy with bone graft from heel and tailor's bunionectomy of the left foot all hardware in good position alignment is maintained, there is good early consolidation across the arthrodesis site, some lucency medially good bone callus formation across the fifth met osteotomy Assessment:   1. Acquired hallux valgus of left foot   2. Tailor's bunionette, left   3. Post-operative state    Plan:  Patient was evaluated and treated and all questions answered.  S/p foot surgery left -Progressing as expected post-operatively. -XR: As noted above -WB Status: Begin transition to weightbearing in shoes no going barefoot and I reviewed the transition process with her.  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

## 2021-09-01 ENCOUNTER — Ambulatory Visit (INDEPENDENT_AMBULATORY_CARE_PROVIDER_SITE_OTHER): Payer: BC Managed Care – PPO

## 2021-09-01 ENCOUNTER — Ambulatory Visit (INDEPENDENT_AMBULATORY_CARE_PROVIDER_SITE_OTHER): Payer: BC Managed Care – PPO | Admitting: Podiatry

## 2021-09-01 DIAGNOSIS — M21622 Bunionette of left foot: Secondary | ICD-10-CM

## 2021-09-01 DIAGNOSIS — M2012 Hallux valgus (acquired), left foot: Secondary | ICD-10-CM

## 2021-09-01 DIAGNOSIS — Z9889 Other specified postprocedural states: Secondary | ICD-10-CM

## 2021-09-05 NOTE — Progress Notes (Signed)
?  Subjective:  ?Patient ID: Claudia Ellison, female    DOB: May 17, 1966,  MRN: [id number] ? ?Chief Complaint  ?Patient presents with  ? Routine Post Op  ?  POV #4 DOS 06/05/2021 BUNIONECTOMY OF LT FOOT W/LAPIDUS PROC, CUT OF BONE IN BIG TOE(AKIN PROC), BONE GRAFT FROM HEEL, TAILORS BUNION CORRECTION  ? ? ? ?56 y.o. female returns for post-op check.  Overall doing well her swelling has reduced and she is having little pain she is in regular shoe gear and tolerating it well ? ?Review of Systems: Negative except as noted in the HPI. Denies N/V/F/Ch. ? ? ?Objective:  ?There were no vitals filed for this visit. ?There is no height or weight on file to calculate BMI. ?Constitutional Well developed. ?Well nourished.  ?Vascular Foot warm and well perfused. ?Capillary refill normal to all digits.  Calf is soft and supple, no posterior calf or knee pain, negative Homans' sign  ?Neurologic Normal speech. ?Oriented to person, place, and time. ?Epicritic sensation to light touch grossly present bilaterally.  ?Dermatologic Skin healing well without signs of infection. Skin edges well coapted without signs of infection.  Moderate edema  ?Orthopedic: Tenderness to palpation noted about the surgical site.  ? ?Multiple view plain film radiographs: Status post Lapidus and Akin bunionectomy with bone graft from heel and tailor's bunionectomy of the left foot all hardware in good position alignment is maintained, arthrodesis sites have healed, continued bone callus formation around the fifth metatarsal ?Assessment:  ? ?1. Acquired hallux valgus of left foot   ? ?Plan:  ?Patient was evaluated and treated and all questions answered. ? ?S/p foot surgery left ?-Overall doing well she may continue her regular activity and regular shoe gear and resume light exercise as well as her barre class, discussed with her to avoid any kind of impact activity still for total of 6 months.  I will see her back as needed if she has any issues with the  foot. ? ?Return if symptoms worsen or fail to improve.  ?

## 2021-10-20 DIAGNOSIS — D649 Anemia, unspecified: Secondary | ICD-10-CM | POA: Diagnosis not present

## 2021-10-20 DIAGNOSIS — R7989 Other specified abnormal findings of blood chemistry: Secondary | ICD-10-CM | POA: Diagnosis not present

## 2021-10-20 DIAGNOSIS — M859 Disorder of bone density and structure, unspecified: Secondary | ICD-10-CM | POA: Diagnosis not present

## 2021-10-23 DIAGNOSIS — H2513 Age-related nuclear cataract, bilateral: Secondary | ICD-10-CM | POA: Diagnosis not present

## 2021-10-23 DIAGNOSIS — H524 Presbyopia: Secondary | ICD-10-CM | POA: Diagnosis not present

## 2021-10-23 DIAGNOSIS — H52223 Regular astigmatism, bilateral: Secondary | ICD-10-CM | POA: Diagnosis not present

## 2021-10-23 DIAGNOSIS — H1045 Other chronic allergic conjunctivitis: Secondary | ICD-10-CM | POA: Diagnosis not present

## 2021-10-27 ENCOUNTER — Other Ambulatory Visit: Payer: Self-pay | Admitting: Internal Medicine

## 2021-10-27 DIAGNOSIS — E785 Hyperlipidemia, unspecified: Secondary | ICD-10-CM

## 2021-10-27 DIAGNOSIS — Z0000 Encounter for general adult medical examination without abnormal findings: Secondary | ICD-10-CM | POA: Diagnosis not present

## 2021-10-27 DIAGNOSIS — R030 Elevated blood-pressure reading, without diagnosis of hypertension: Secondary | ICD-10-CM | POA: Diagnosis not present

## 2021-10-27 DIAGNOSIS — Z1331 Encounter for screening for depression: Secondary | ICD-10-CM | POA: Diagnosis not present

## 2021-10-27 DIAGNOSIS — Z1339 Encounter for screening examination for other mental health and behavioral disorders: Secondary | ICD-10-CM | POA: Diagnosis not present

## 2021-12-10 ENCOUNTER — Ambulatory Visit
Admission: RE | Admit: 2021-12-10 | Discharge: 2021-12-10 | Disposition: A | Discharge status: Home / Self Care | Payer: No Typology Code available for payment source | Source: Ambulatory Visit | Attending: Internal Medicine | Admitting: Internal Medicine

## 2021-12-10 DIAGNOSIS — E785 Hyperlipidemia, unspecified: Secondary | ICD-10-CM

## 2022-07-23 DIAGNOSIS — Z6822 Body mass index (BMI) 22.0-22.9, adult: Secondary | ICD-10-CM | POA: Diagnosis not present

## 2022-07-23 DIAGNOSIS — Z01419 Encounter for gynecological examination (general) (routine) without abnormal findings: Secondary | ICD-10-CM | POA: Diagnosis not present

## 2022-07-23 DIAGNOSIS — Z1231 Encounter for screening mammogram for malignant neoplasm of breast: Secondary | ICD-10-CM | POA: Diagnosis not present

## 2022-09-01 ENCOUNTER — Ambulatory Visit: Payer: BC Managed Care – PPO | Admitting: Internal Medicine

## 2022-11-02 DIAGNOSIS — H52223 Regular astigmatism, bilateral: Secondary | ICD-10-CM | POA: Diagnosis not present

## 2022-11-02 DIAGNOSIS — H524 Presbyopia: Secondary | ICD-10-CM | POA: Diagnosis not present

## 2022-11-09 ENCOUNTER — Ambulatory Visit: Payer: 59 | Admitting: Podiatry

## 2022-11-18 DIAGNOSIS — D649 Anemia, unspecified: Secondary | ICD-10-CM | POA: Diagnosis not present

## 2022-11-18 DIAGNOSIS — M8580 Other specified disorders of bone density and structure, unspecified site: Secondary | ICD-10-CM | POA: Diagnosis not present

## 2022-11-18 DIAGNOSIS — R7989 Other specified abnormal findings of blood chemistry: Secondary | ICD-10-CM | POA: Diagnosis not present

## 2022-11-18 DIAGNOSIS — D72819 Decreased white blood cell count, unspecified: Secondary | ICD-10-CM | POA: Diagnosis not present

## 2022-11-25 DIAGNOSIS — Z1339 Encounter for screening examination for other mental health and behavioral disorders: Secondary | ICD-10-CM | POA: Diagnosis not present

## 2022-11-25 DIAGNOSIS — R030 Elevated blood-pressure reading, without diagnosis of hypertension: Secondary | ICD-10-CM | POA: Diagnosis not present

## 2022-11-25 DIAGNOSIS — Z1331 Encounter for screening for depression: Secondary | ICD-10-CM | POA: Diagnosis not present

## 2022-11-25 DIAGNOSIS — Z0000 Encounter for general adult medical examination without abnormal findings: Secondary | ICD-10-CM | POA: Diagnosis not present

## 2023-03-03 DIAGNOSIS — M9905 Segmental and somatic dysfunction of pelvic region: Secondary | ICD-10-CM | POA: Diagnosis not present

## 2023-03-03 DIAGNOSIS — M4125 Other idiopathic scoliosis, thoracolumbar region: Secondary | ICD-10-CM | POA: Diagnosis not present

## 2023-03-03 DIAGNOSIS — M9903 Segmental and somatic dysfunction of lumbar region: Secondary | ICD-10-CM | POA: Diagnosis not present

## 2023-03-03 DIAGNOSIS — M9902 Segmental and somatic dysfunction of thoracic region: Secondary | ICD-10-CM | POA: Diagnosis not present

## 2023-03-04 DIAGNOSIS — M9905 Segmental and somatic dysfunction of pelvic region: Secondary | ICD-10-CM | POA: Diagnosis not present

## 2023-03-04 DIAGNOSIS — M9903 Segmental and somatic dysfunction of lumbar region: Secondary | ICD-10-CM | POA: Diagnosis not present

## 2023-03-04 DIAGNOSIS — M9902 Segmental and somatic dysfunction of thoracic region: Secondary | ICD-10-CM | POA: Diagnosis not present

## 2023-03-04 DIAGNOSIS — M4125 Other idiopathic scoliosis, thoracolumbar region: Secondary | ICD-10-CM | POA: Diagnosis not present

## 2023-03-07 DIAGNOSIS — M9905 Segmental and somatic dysfunction of pelvic region: Secondary | ICD-10-CM | POA: Diagnosis not present

## 2023-03-07 DIAGNOSIS — M9902 Segmental and somatic dysfunction of thoracic region: Secondary | ICD-10-CM | POA: Diagnosis not present

## 2023-03-07 DIAGNOSIS — M4125 Other idiopathic scoliosis, thoracolumbar region: Secondary | ICD-10-CM | POA: Diagnosis not present

## 2023-03-07 DIAGNOSIS — M9903 Segmental and somatic dysfunction of lumbar region: Secondary | ICD-10-CM | POA: Diagnosis not present

## 2023-07-27 DIAGNOSIS — J3089 Other allergic rhinitis: Secondary | ICD-10-CM | POA: Diagnosis not present

## 2023-11-21 DIAGNOSIS — M1812 Unilateral primary osteoarthritis of first carpometacarpal joint, left hand: Secondary | ICD-10-CM | POA: Diagnosis not present

## 2024-02-02 DIAGNOSIS — Z01419 Encounter for gynecological examination (general) (routine) without abnormal findings: Secondary | ICD-10-CM | POA: Diagnosis not present

## 2024-02-02 DIAGNOSIS — Z1231 Encounter for screening mammogram for malignant neoplasm of breast: Secondary | ICD-10-CM | POA: Diagnosis not present

## 2024-02-02 DIAGNOSIS — Z6825 Body mass index (BMI) 25.0-25.9, adult: Secondary | ICD-10-CM | POA: Diagnosis not present

## 2024-02-08 DIAGNOSIS — H524 Presbyopia: Secondary | ICD-10-CM | POA: Diagnosis not present

## 2024-02-08 DIAGNOSIS — H1045 Other chronic allergic conjunctivitis: Secondary | ICD-10-CM | POA: Diagnosis not present

## 2024-02-08 DIAGNOSIS — H5203 Hypermetropia, bilateral: Secondary | ICD-10-CM | POA: Diagnosis not present

## 2024-02-08 DIAGNOSIS — H2513 Age-related nuclear cataract, bilateral: Secondary | ICD-10-CM | POA: Diagnosis not present

## 2024-02-08 DIAGNOSIS — H52223 Regular astigmatism, bilateral: Secondary | ICD-10-CM | POA: Diagnosis not present

## 2024-03-26 DIAGNOSIS — M1812 Unilateral primary osteoarthritis of first carpometacarpal joint, left hand: Secondary | ICD-10-CM | POA: Diagnosis not present

## 2024-05-16 DIAGNOSIS — Z13820 Encounter for screening for osteoporosis: Secondary | ICD-10-CM | POA: Diagnosis not present

## 2024-06-07 ENCOUNTER — Telehealth: Payer: Self-pay

## 2024-06-07 NOTE — Telephone Encounter (Signed)
 Attempted to reach patient concerning colonoscopy recall; unable to speak with patient;  left message and number to the office for patient to call back and schedule appts;

## 2024-06-13 ENCOUNTER — Ambulatory Visit

## 2024-06-13 VITALS — Ht 67.0 in | Wt 164.6 lb

## 2024-06-13 DIAGNOSIS — Z860100 Personal history of colon polyps, unspecified: Secondary | ICD-10-CM

## 2024-06-13 MED ORDER — NA SULFATE-K SULFATE-MG SULF 17.5-3.13-1.6 GM/177ML PO SOLN: 1.0000 | Freq: Once | ORAL | 0 refills | Status: AC

## 2024-06-13 NOTE — Progress Notes (Signed)
 RN confirmed patient name, date of birth, and address RN confirmed date and time of procedure RN reviewed and confirmed allergies  RN reviewed and updated current medications; confirmed preferred pharmacy Pt is not on diet pills nor GLP-1 medications Pt is not on blood thinners RN reviewed medical & surgical hx  Pt states issues with constipation; takes stool softener or fiber to alleviate  Diabetic - no No A fib or A flutter No cardiac tests are pending  Pt is not on home 02  No issues known with past sedation with any surgeries or procedures Patient denies ever being told they had issues or difficulty with intubation  Patient unaware of any fh of malignant hyperthermia Ambulates independently RN reviewed prep instructions and explained time frames for holding certain medications RN answered patient questions; patient stated understanding Prep instructions sent

## 2024-06-28 ENCOUNTER — Encounter: Payer: Self-pay | Admitting: Gastroenterology

## 2024-07-17 ENCOUNTER — Encounter: Admitting: Gastroenterology
